# Patient Record
Sex: Male | Born: 1955 | Race: White | Hispanic: No | Marital: Single
Health system: Southern US, Community
[De-identification: ages and names within clinical notes are randomized; demographics above are authoritative.]

## PROBLEM LIST (undated history)

## (undated) DIAGNOSIS — G35 Multiple sclerosis: Secondary | ICD-10-CM

## (undated) DIAGNOSIS — R569 Unspecified convulsions: Secondary | ICD-10-CM

## (undated) DIAGNOSIS — H539 Unspecified visual disturbance: Secondary | ICD-10-CM

## (undated) HISTORY — DX: Multiple sclerosis: G35

## (undated) HISTORY — DX: Unspecified convulsions: R56.9

## (undated) HISTORY — DX: Unspecified visual disturbance: H53.9

---

## 1998-02-24 ENCOUNTER — Emergency Department (HOSPITAL_COMMUNITY): Admission: EM | Admit: 1998-02-24 | Discharge: 1998-02-24 | Payer: Self-pay | Admitting: Emergency Medicine

## 1998-02-25 ENCOUNTER — Encounter: Payer: Self-pay | Admitting: Emergency Medicine

## 1999-04-07 ENCOUNTER — Encounter: Admission: RE | Admit: 1999-04-07 | Discharge: 1999-04-07 | Payer: Self-pay | Admitting: Family Medicine

## 2001-04-06 ENCOUNTER — Emergency Department (HOSPITAL_COMMUNITY): Admission: EM | Admit: 2001-04-06 | Discharge: 2001-04-06 | Payer: Self-pay | Admitting: *Deleted

## 2003-10-10 ENCOUNTER — Emergency Department (HOSPITAL_COMMUNITY): Admission: EM | Admit: 2003-10-10 | Discharge: 2003-10-11 | Payer: Self-pay | Admitting: Emergency Medicine

## 2010-11-28 ENCOUNTER — Telehealth: Payer: Self-pay | Admitting: Internal Medicine

## 2010-11-29 NOTE — Telephone Encounter (Signed)
Error/njr °

## 2013-11-27 ENCOUNTER — Ambulatory Visit (INDEPENDENT_AMBULATORY_CARE_PROVIDER_SITE_OTHER): Payer: BC Managed Care – PPO | Admitting: Family Medicine

## 2013-11-27 VITALS — BP 116/74 | HR 86 | Temp 98.4°F | Resp 18

## 2013-11-27 DIAGNOSIS — Z720 Tobacco use: Secondary | ICD-10-CM

## 2013-11-27 DIAGNOSIS — J069 Acute upper respiratory infection, unspecified: Secondary | ICD-10-CM

## 2013-11-27 DIAGNOSIS — G35 Multiple sclerosis: Secondary | ICD-10-CM

## 2013-11-27 MED ORDER — DOXYCYCLINE HYCLATE 100 MG PO CAPS: 100.0000 mg | ORAL_CAPSULE | Freq: Two times a day (BID) | ORAL | Status: DC

## 2013-11-27 NOTE — Progress Notes (Signed)
   Subjective:    Patient ID: Derek Sims, male    DOB: [birth year], 57 y.o.   MRN: [id number]  HPI Patient presents today with 4 day history of dry cough and "lung pain" with coughing. No sputum production. Smokes 1 ppd. He has taking OTC Robitussin with good relief of cough. He slept well last night. He has never ben diagnosed with COPD/asthma. Patient has tried to quit smoking a couple of times in the past. Not currently considering quitting even though his mother keeps after him.   Patient with history of MS. Takes betaseron injections every other day. Is wheelchair bound. Is able to transfer and perform ADLs. He drives. He sees Dr. Renne Crigler in [place].His disease has been stable for many years. He was diagnosed about 30 years ago. 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   Past Medical History  Diagnosis Date  . MS (multiple sclerosis)    History reviewed. No pertinent past surgical history. History reviewed. No pertinent family history. History  Substance Use Topics  . Smoking status: Current Every Day Smoker -- 1.00 packs/day    Types: Cigarettes  . Smokeless tobacco: Not on file  . Alcohol Use: Not on file    Review of Systems No fever/chills, no wheezing, no SOB, some sore throat, congestion and ear pain.     Objective:   Physical Exam  Vitals reviewed. Constitutional: He is oriented to person, place, and time. He appears well-developed and well-nourished.  HENT:  Head: Normocephalic and atraumatic.  Right Ear: Tympanic membrane, external ear and ear canal normal.  Left Ear: Tympanic membrane, external ear and ear canal normal.  Nose: Mucosal edema present. Right sinus exhibits no maxillary sinus tenderness and no frontal sinus tenderness. Left sinus exhibits no maxillary sinus tenderness and no frontal sinus tenderness.  Mouth/Throat: Uvula is midline and oropharynx is clear and moist.  Eyes: Conjunctivae are normal.  Neck: Normal range of motion. Neck supple.    Cardiovascular: Normal rate, regular rhythm and normal heart sounds.   Pulmonary/Chest: Effort normal. No respiratory distress. Wheezes: few faint upper, anterior expiratory wheezes after coughing. He has no rales. He exhibits no tenderness.  Musculoskeletal:  Patient able to transfer from wheelchair, able to stand briefly.    Neurological: He is alert and oriented to person, place, and time.  Skin: Skin is warm and dry.  Psychiatric: He has a normal mood and affect. His behavior is normal. Judgment and thought content normal.      Assessment & Plan:  Discussed with Dr. Patsy Lager 1. Acute upper respiratory infection -Provided written and verbal information regarding diagnosis and treatment. - doxycycline (VIBRAMYCIN) 100 MG capsule; Take 1 capsule (100 mg total) by mouth 2 (two) times daily.  Dispense: 14 capsule; Refill: 0 - continue OTC cough syrup -push fluids -Mucinex q12 hours PRN -Follow up if no improvement in 3-4 days, sooner if worsening symptoms/fever/chills/SOB  2. Tobacco abuse -Encouraged smoking cessation  3. Multiple sclerosis -Continued follow up as scheduled.    Emi Belfast, FNP-BC  [facility], [facility]  11/27/2013 2:35 PM

## 2013-11-27 NOTE — Patient Instructions (Signed)
Plain Mucinex  Acute Bronchitis Bronchitis is inflammation of the airways that extend from the windpipe into the lungs (bronchi). The inflammation often causes mucus to develop. This leads to a cough, which is the most common symptom of bronchitis.  In acute bronchitis, the condition usually develops suddenly and goes away over time, usually in a couple weeks. Smoking, allergies, and asthma can make bronchitis worse. Repeated episodes of bronchitis may cause further lung problems.  CAUSES Acute bronchitis is most often caused by the same virus that causes a cold. The virus can spread from person to person (contagious) through coughing, sneezing, and touching contaminated objects. SIGNS AND SYMPTOMS   Cough.   Fever.   Coughing up mucus.   Body aches.   Chest congestion.   Chills.   Shortness of breath.   Sore throat.  DIAGNOSIS  Acute bronchitis is usually diagnosed through a physical exam. Your health care provider will also ask you questions about your medical history. Tests, such as chest X-rays, are sometimes done to rule out other conditions.  TREATMENT  Acute bronchitis usually goes away in a couple weeks. Oftentimes, no medical treatment is necessary. Medicines are sometimes given for relief of fever or cough. Antibiotic medicines are usually not needed but may be prescribed in certain situations. In some cases, an inhaler may be recommended to help reduce shortness of breath and control the cough. A cool mist vaporizer may also be used to help thin bronchial secretions and make it easier to clear the chest.  HOME CARE INSTRUCTIONS  Get plenty of rest.   Drink enough fluids to keep your urine clear or pale yellow (unless you have a medical condition that requires fluid restriction). Increasing fluids may help thin your respiratory secretions (sputum) and reduce chest congestion, and it will prevent dehydration.   Take medicines only as directed by your health care  provider.  If you were prescribed an antibiotic medicine, finish it all even if you start to feel better.  Avoid smoking and secondhand smoke. Exposure to cigarette smoke or irritating chemicals will make bronchitis worse. If you are a smoker, consider using nicotine gum or skin patches to help control withdrawal symptoms. Quitting smoking will help your lungs heal faster.   Reduce the chances of another bout of acute bronchitis by washing your hands frequently, avoiding people with cold symptoms, and trying not to touch your hands to your mouth, nose, or eyes.   Keep all follow-up visits as directed by your health care provider.  SEEK MEDICAL CARE IF: Your symptoms do not improve after 1 week of treatment.  SEEK IMMEDIATE MEDICAL CARE IF:  You develop an increased fever or chills.   You have chest pain.   You have severe shortness of breath.  You have bloody sputum.   You develop dehydration.  You faint or repeatedly feel like you are going to pass out.  You develop repeated vomiting.  You develop a severe headache. MAKE SURE YOU:   Understand these instructions.  Will watch your condition.  Will get help right away if you are not doing well or get worse. Document Released: 03/08/2004 Document Revised: 06/15/2013 Document Reviewed: 07/22/2012 [facility] Patient Information 2015 [place], [place]. This information is not intended to replace advice given to you by your health care provider. Make sure you discuss any questions you have with your health care provider.

## 2014-04-04 ENCOUNTER — Emergency Department (HOSPITAL_COMMUNITY): Payer: Medicare Other

## 2014-04-04 ENCOUNTER — Emergency Department (HOSPITAL_COMMUNITY)
Admission: EM | Admit: 2014-04-04 | Discharge: 2014-04-04 | Disposition: A | Discharge status: Home / Self Care | Payer: Medicare Other | Attending: Emergency Medicine | Admitting: Emergency Medicine

## 2014-04-04 DIAGNOSIS — G35 Multiple sclerosis: Secondary | ICD-10-CM | POA: Insufficient documentation

## 2014-04-04 DIAGNOSIS — R05 Cough: Secondary | ICD-10-CM | POA: Diagnosis not present

## 2014-04-04 DIAGNOSIS — Z79899 Other long term (current) drug therapy: Secondary | ICD-10-CM | POA: Insufficient documentation

## 2014-04-04 DIAGNOSIS — N3000 Acute cystitis without hematuria: Secondary | ICD-10-CM | POA: Insufficient documentation

## 2014-04-04 DIAGNOSIS — J42 Unspecified chronic bronchitis: Secondary | ICD-10-CM | POA: Diagnosis not present

## 2014-04-04 DIAGNOSIS — Z792 Long term (current) use of antibiotics: Secondary | ICD-10-CM | POA: Diagnosis not present

## 2014-04-04 DIAGNOSIS — R030 Elevated blood-pressure reading, without diagnosis of hypertension: Secondary | ICD-10-CM | POA: Diagnosis not present

## 2014-04-04 DIAGNOSIS — R55 Syncope and collapse: Secondary | ICD-10-CM | POA: Diagnosis not present

## 2014-04-04 DIAGNOSIS — J341 Cyst and mucocele of nose and nasal sinus: Secondary | ICD-10-CM | POA: Diagnosis not present

## 2014-04-04 DIAGNOSIS — J0140 Acute pansinusitis, unspecified: Secondary | ICD-10-CM | POA: Diagnosis not present

## 2014-04-04 DIAGNOSIS — R410 Disorientation, unspecified: Secondary | ICD-10-CM

## 2014-04-04 DIAGNOSIS — M79662 Pain in left lower leg: Secondary | ICD-10-CM | POA: Diagnosis present

## 2014-04-04 DIAGNOSIS — G4751 Confusional arousals: Secondary | ICD-10-CM | POA: Insufficient documentation

## 2014-04-04 DIAGNOSIS — Z720 Tobacco use: Secondary | ICD-10-CM | POA: Insufficient documentation

## 2014-04-04 DIAGNOSIS — F17200 Nicotine dependence, unspecified, uncomplicated: Secondary | ICD-10-CM | POA: Diagnosis not present

## 2014-04-04 DIAGNOSIS — R109 Unspecified abdominal pain: Secondary | ICD-10-CM | POA: Diagnosis not present

## 2014-04-04 LAB — CBC WITH DIFFERENTIAL/PLATELET
BASOS PCT: 0 % (ref 0–1)
Basophils Absolute: 0 10*3/uL (ref 0.0–0.1)
EOS PCT: 1 % (ref 0–5)
Eosinophils Absolute: 0.1 10*3/uL (ref 0.0–0.7)
HCT: 44.2 % (ref 39.0–52.0)
Hemoglobin: 14.8 g/dL (ref 13.0–17.0)
Lymphocytes Relative: 5 % — ABNORMAL LOW (ref 12–46)
Lymphs Abs: 0.5 10*3/uL — ABNORMAL LOW (ref 0.7–4.0)
MCH: 29 pg (ref 26.0–34.0)
MCHC: 33.5 g/dL (ref 30.0–36.0)
MCV: 86.5 fL (ref 78.0–100.0)
MONOS PCT: 6 % (ref 3–12)
Monocytes Absolute: 0.6 10*3/uL (ref 0.1–1.0)
NEUTROS ABS: 9.4 10*3/uL — AB (ref 1.7–7.7)
Neutrophils Relative %: 88 % — ABNORMAL HIGH (ref 43–77)
Platelets: 178 10*3/uL (ref 150–400)
RBC: 5.11 MIL/uL (ref 4.22–5.81)
RDW: 12.7 % (ref 11.5–15.5)
WBC: 10.6 10*3/uL — ABNORMAL HIGH (ref 4.0–10.5)

## 2014-04-04 LAB — URINE MICROSCOPIC-ADD ON

## 2014-04-04 LAB — URINALYSIS, ROUTINE W REFLEX MICROSCOPIC
Bilirubin Urine: NEGATIVE
Glucose, UA: NEGATIVE mg/dL
KETONES UR: NEGATIVE mg/dL
NITRITE: POSITIVE — AB
Protein, ur: NEGATIVE mg/dL
SPECIFIC GRAVITY, URINE: 1.016 (ref 1.005–1.030)
Urobilinogen, UA: 0.2 mg/dL (ref 0.0–1.0)
pH: 7.5 (ref 5.0–8.0)

## 2014-04-04 LAB — RAPID URINE DRUG SCREEN, HOSP PERFORMED
AMPHETAMINES: NOT DETECTED
Barbiturates: NOT DETECTED
Benzodiazepines: NOT DETECTED
Cocaine: NOT DETECTED
Opiates: NOT DETECTED
TETRAHYDROCANNABINOL: NOT DETECTED

## 2014-04-04 LAB — COMPREHENSIVE METABOLIC PANEL
ALBUMIN: 3.9 g/dL (ref 3.5–5.2)
ALT: 49 U/L (ref 0–53)
AST: 34 U/L (ref 0–37)
Alkaline Phosphatase: 72 U/L (ref 39–117)
Anion gap: 6 (ref 5–15)
BILIRUBIN TOTAL: 0.6 mg/dL (ref 0.3–1.2)
BUN: 11 mg/dL (ref 6–23)
CO2: 27 mmol/L (ref 19–32)
CREATININE: 1.18 mg/dL (ref 0.50–1.35)
Calcium: 9.8 mg/dL (ref 8.4–10.5)
Chloride: 105 mmol/L (ref 96–112)
GFR calc Af Amer: 77 mL/min — ABNORMAL LOW (ref >90)
GFR, EST NON AFRICAN AMERICAN: 67 mL/min — AB (ref >90)
GLUCOSE: 126 mg/dL — AB (ref 70–99)
POTASSIUM: 4 mmol/L (ref 3.5–5.1)
SODIUM: 138 mmol/L (ref 135–145)
Total Protein: 6.9 g/dL (ref 6.0–8.3)

## 2014-04-04 LAB — CK: Total CK: 86 U/L (ref 7–232)

## 2014-04-04 LAB — ETHANOL

## 2014-04-04 MED ORDER — SODIUM CHLORIDE 0.9 % IV BOLUS (SEPSIS)
1000.0000 mL | Freq: Once | INTRAVENOUS | Status: AC
  Administered 2014-04-04: 1000 mL via INTRAVENOUS

## 2014-04-04 MED ORDER — NITROFURANTOIN MONOHYD MACRO 100 MG PO CAPS: 100.0000 mg | ORAL_CAPSULE | Freq: Two times a day (BID) | ORAL | Status: DC

## 2014-04-04 MED ORDER — CEPHALEXIN 500 MG PO CAPS: 500.0000 mg | ORAL_CAPSULE | Freq: Four times a day (QID) | ORAL | Status: DC

## 2014-04-04 NOTE — ED Provider Notes (Signed)
CSN: [id number]     Arrival date & time 04/04/14  1557 History   First MD Initiated Contact with Patient 04/04/14 1557     Chief Complaint  Patient presents with  . Extremity Pain     (Consider location/radiation/quality/duration/timing/severity/associated sxs/prior Treatment) Patient is a 59 y.o. male presenting with general illness.  Illness Location:  "everywhere" LE>UE Quality:  Pain Severity:  Moderate Onset quality:  Gradual Duration:  1 day Timing:  Constant Progression:  Resolved Chronicity:  Recurrent Context:  HO MS, multiple flares, ? seizure this morning which pt has full awareness of Relieved by:  Fentanyl given prior to arrival Worsened by:  Nothing Associated symptoms: abdominal pain, cough, loss of consciousness and nausea   Associated symptoms: no diarrhea and no vomiting     Past Medical History  Diagnosis Date  . MS (multiple sclerosis)    No past surgical history on file. No family history on file. History  Substance Use Topics  . Smoking status: Current Every Day Smoker -- 1.00 packs/day    Types: Cigarettes  . Smokeless tobacco: Not on file  . Alcohol Use: Not on file    Review of Systems  Respiratory: Positive for cough.   Gastrointestinal: Positive for nausea and abdominal pain. Negative for vomiting and diarrhea.  Neurological: Positive for loss of consciousness.  All other systems reviewed and are negative.     Allergies  Review of patient's allergies indicates no known allergies.  Home Medications   Prior to Admission medications   Medication Sig Start Date End Date Taking? Authorizing Provider  Interferon Beta-1b (BETASERON [place]) Inject into the skin every other day.   Yes Historical Provider, MD  cephALEXin (KEFLEX) 500 MG capsule Take 1 capsule (500 mg total) by mouth 4 (four) times daily. 04/04/14   Mirian Mo, MD  doxycycline (VIBRAMYCIN) 100 MG capsule Take 1 capsule (100 mg total) by mouth 2 (two) times daily. 11/27/13    Emi Belfast, FNP   BP 115/74 mmHg  Pulse 106  Temp(Src) 98.2 F (36.8 C) (Oral)  Resp 16  SpO2 96% Physical Exam  Constitutional: He is oriented to person, place, and time. He appears well-developed and well-nourished.  HENT:  Head: Normocephalic and atraumatic.  Eyes: Conjunctivae and EOM are normal.  Neck: Normal range of motion. Neck supple.  Cardiovascular: Normal rate, regular rhythm and normal heart sounds.   Pulmonary/Chest: Effort normal and breath sounds normal. No respiratory distress.  Abdominal: He exhibits no distension. There is no tenderness. There is no rebound and no guarding.  Musculoskeletal: Normal range of motion.  Neurological: He is alert and oriented to person, place, and time. No cranial nerve deficit. GCS eye subscore is 4. GCS verbal subscore is 5. GCS motor subscore is 6.  Sensation diminished bil LE, strength 0/5 bil le, strength 5/5 ue with intact sensation  Skin: Skin is warm and dry.  Vitals reviewed.   ED Course  Procedures (including critical care time) Labs Review Labs Reviewed  CBC WITH DIFFERENTIAL/PLATELET - Abnormal; Notable for the following:    WBC 10.6 (*)    Neutrophils Relative % 88 (*)    Neutro Abs 9.4 (*)    Lymphocytes Relative 5 (*)    Lymphs Abs 0.5 (*)    All other components within normal limits  COMPREHENSIVE METABOLIC PANEL - Abnormal; Notable for the following:    Glucose, Bld 126 (*)    GFR calc non Af Amer 67 (*)    GFR calc Af  Amer 40 (*)    All other components within normal limits  URINALYSIS, ROUTINE W REFLEX MICROSCOPIC - Abnormal; Notable for the following:    APPearance CLOUDY (*)    Hgb urine dipstick TRACE (*)    Nitrite POSITIVE (*)    Leukocytes, UA LARGE (*)    All other components within normal limits  URINE MICROSCOPIC-ADD ON - Abnormal; Notable for the following:    Bacteria, UA MANY (*)    All other components within normal limits  URINE CULTURE  CK  ETHANOL  URINE RAPID DRUG SCREEN  (HOSP PERFORMED)    Imaging Review Dg Chest 2 View  04/04/2014   CLINICAL DATA:  Bilateral lower extremity, hand and neck pain. History of similar episodes previously. Multiple sclerosis. Smoker.  EXAM: CHEST  2 VIEW  COMPARISON:  None.  FINDINGS: Normal sized heart. Clear lungs. Minimal diffuse peribronchial thickening and accentuation of the interstitial markings. Minimal scoliosis.  IMPRESSION: Minimal chronic bronchitic changes.  No acute abnormality.   Electronically Signed   By: Beckie Salts M.D.   On: 04/04/2014 17:43   Ct Head Wo Contrast  04/04/2014   CLINICAL DATA:  Bilateral lower extremity, hand, and neck pain. History of MS. History of prior similar episodes.  EXAM: CT HEAD WITHOUT CONTRAST  TECHNIQUE: Contiguous axial images were obtained from the base of the skull through the vertex without intravenous contrast.  COMPARISON:  None.  FINDINGS: Mild diffuse cerebral atrophy. Ventricular dilatation consistent with central atrophy. Low-attenuation changes in the deep white matter consistent with nonspecific white matter disease. Is could be related to patient's history of MS. No mass effect or midline shift. No abnormal extra-axial fluid collections. Gray-white matter junctions are distinct. Basal cisterns are not effaced. No evidence of acute intracranial hemorrhage. No depressed skull fractures. Mild mucosal thickening in the paranasal sinuses with small retention cyst in the left maxillary antrum. Mastoid air cells are not opacified.  IMPRESSION: No acute intracranial abnormalities. Mild chronic atrophy and white matter disease.   Electronically Signed   By: Burman Nieves M.D.   On: 04/04/2014 20:57     EKG Interpretation   Date/Time:  Sunday April 04 2014 16:08:05 EST Ventricular Rate:  111 PR Interval:  147 QRS Duration: 93 QT Interval:  312 QTC Calculation: 424 R Axis:   88 Text Interpretation:  Sinus tachycardia No significant change since last  tracing Confirmed by  Mirian Mo [phone]) on 04/04/2014 4:18:56 PM      MDM   Final diagnoses:  Confusion  Multiple sclerosis  Acute cystitis without hematuria    60 y.o. male with pertinent PMH of MS presents with confusion, generalized pain, and perseveration on thirst.  He has a ho chronic pain episodes, in that aspect current episode was similar.  Apparently, the patient was driving in the car with his mother, began complaining of pain all over. They called EMS and pulled over. On EMS arrival the patient was given 50 mg of fentanyl with total relief of pain. 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 This is unchanged from baseline. Approximately an hour and a half into the patient's care had resolution of his perseveration and per family return to baseline. He has never done this in the past.   Workup as above remarkable only for UTI in pt with indwelling foley.  I discussed the patient with neurology who  did not recommend acute admission, felt patient appropriate for outpatient EEG by his neurologist. We discussed use of antiepileptics, agreed that this was not indicated given the very atypical nature if this was a seizure. Patient discharged home with Keflex. Standard return precautions given.  I have reviewed all laboratory and imaging studies if ordered as above  1. Confusion   2. Multiple sclerosis   3. Acute cystitis without hematuria         Mirian Mo, MD 04/04/14 [phone]

## 2014-04-04 NOTE — ED Notes (Signed)
Dr. Gentry at bedside. 

## 2014-04-04 NOTE — ED Notes (Signed)
Pt via [facility] with c/o bilateral lower extremity, hand, and neck pain s/p MS.  Hx of similar episodes.  Given 50 mg fentanyl with 0/10 pain.  Pt in NAD, A&O.

## 2014-04-04 NOTE — Discharge Instructions (Signed)
Altered Mental Status Altered mental status most often refers to an abnormal change in your responsiveness and awareness. It can affect your speech, thought, mobility, memory, attention span, or alertness. It can range from slight confusion to complete unresponsiveness (coma). Altered mental status can be a sign of a serious underlying medical condition. Rapid evaluation and medical treatment is necessary for patients having an altered mental status. CAUSES   Low blood sugar (hypoglycemia) or diabetes.  Severe loss of body fluids (dehydration) or a body salt (electrolyte) imbalance.  A stroke or other neurologic problem, such as dementia or delirium.  A head injury or tumor.  A drug or alcohol overdose.  Exposure to toxins or poisons.  Depression, anxiety, and stress.  A low oxygen level (hypoxia).  An infection.  Blood loss.  Twitching or shaking (seizure).  Heart problems, such as heart attack or heart rhythm problems (arrhythmias).  A body temperature that is too low or too high (hypothermia or hyperthermia). DIAGNOSIS  A diagnosis is based on your history, symptoms, physical and neurologic examinations, and diagnostic tests. Diagnostic tests may include:  Measurement of your blood pressure, pulse, breathing, and oxygen levels (vital signs).  Blood tests.  Urine tests.  X-ray exams.  A computerized magnetic scan (magnetic resonance imaging, MRI).  A computerized X-ray scan (computed tomography, CT scan). TREATMENT  Treatment will depend on the cause. Treatment may include:  Management of an underlying medical or mental health condition.  Critical care or support in the hospital. HOME CARE INSTRUCTIONS   Only take over-the-counter or prescription medicines for pain, discomfort, or fever as directed by your caregiver.  Manage underlying conditions as directed by your caregiver.  Eat a healthy, well-balanced diet to maintain strength.  Join a support group or  prevention program to cope with the condition or trauma that caused the altered mental status. Ask your caregiver to help choose a program that works for you.  Follow up with your caregiver for further examination, therapy, or testing as directed. SEEK MEDICAL CARE IF:   You feel unwell or have chills.  You or your family notice a change in your behavior or your alertness.  You have trouble following your caregiver's treatment plan.  You have questions or concerns. SEEK IMMEDIATE MEDICAL CARE IF:   You have a rapid heartbeat or have chest pain.  You have difficulty breathing.  You have a fever.  You have a headache with a stiff neck.  You cough up blood.  You have blood in your urine or stool.  You have severe agitation or confusion. MAKE SURE YOU:   Understand these instructions.  Will watch your condition.  Will get help right away if you are not doing well or get worse. Document Released: 07/19/2009 Document Revised: 04/23/2011 Document Reviewed: 07/19/2009 [facility] Patient Information 2015 [place], [place]. This information is not intended to replace advice given to you by your health care provider. Make sure you discuss any questions you have with your health care provider. Multiple Sclerosis Multiple sclerosis (MS) is a disease of the central nervous system. It leads to the loss of the insulating covering of the nerves (myelin sheath) of your brain. When this happens, brain signals do not get sent properly or may not get sent at all. The age of onset of MS varies.  CAUSES The cause of MS is unknown. However, it is more common in the [place] than in the [place]. RISK FACTORS There is a higher number of women  with MS than men. MS is not an illness that is passed down to you from your family members (inherited). However, your risk of MS is higher if you have a relative with MS. SIGNS AND SYMPTOMS  The symptoms of MS occur in episodes or  attacks. These attacks may last weeks to months. There may be long periods of almost no symptoms between attacks. The symptoms of MS vary. This is because of the many different ways it affects the central nervous system. The main symptoms of MS include:  Vision problems and eye pain.  Numbness.  Weakness.  Inability to move your arms, hands, feet, or legs (paralysis).  Balance problems.  Tremors. DIAGNOSIS  Your health care provider can diagnose MS with the help of imaging exams and lab tests. These may include specialized X-ray exams and spinal fluid tests. The best imaging exam to confirm a diagnosis of MS is an MRI. TREATMENT  There is no known cure for MS, but there are medicines that can decrease the number and frequency of attacks. Steroids are often used for short-term relief. Physical and occupational therapy may also help. There are also many new alternative or complementary treatments available to help control the symptoms of MS. Ask your health care provider if any of these other options are right for you. HOME CARE INSTRUCTIONS   Take medicines as directed by your health care provider.  Exercise as directed by your health care provider. SEEK MEDICAL CARE IF: You begin to feel depressed. SEEK IMMEDIATE MEDICAL CARE IF:  You develop paralysis.  You have problems with bladder, bowel, or sexual function.  You develop mental changes, such as forgetfulness or mood swings.  You have a period of uncontrolled movements (seizure). Document Released: 01/27/2000 Document Revised: 02/03/2013 Document Reviewed: 10/06/2012 [facility] Patient Information 2015 [place], [place]. This information is not intended to replace advice given to you by your health care provider. Make sure you discuss any questions you have with your health care provider.

## 2014-04-06 LAB — URINE CULTURE: Colony Count: >=100000

## 2015-07-21 ENCOUNTER — Encounter: Payer: Self-pay | Admitting: Neurology

## 2015-07-21 ENCOUNTER — Ambulatory Visit (INDEPENDENT_AMBULATORY_CARE_PROVIDER_SITE_OTHER): Payer: Medicare Other | Admitting: Neurology

## 2015-07-21 DIAGNOSIS — G35 Multiple sclerosis: Secondary | ICD-10-CM | POA: Insufficient documentation

## 2015-07-21 DIAGNOSIS — Z993 Dependence on wheelchair: Secondary | ICD-10-CM | POA: Insufficient documentation

## 2015-07-21 DIAGNOSIS — R339 Retention of urine, unspecified: Secondary | ICD-10-CM | POA: Insufficient documentation

## 2015-07-21 DIAGNOSIS — H539 Unspecified visual disturbance: Secondary | ICD-10-CM | POA: Insufficient documentation

## 2015-07-21 DIAGNOSIS — R269 Unspecified abnormalities of gait and mobility: Secondary | ICD-10-CM | POA: Insufficient documentation

## 2015-07-21 NOTE — Progress Notes (Signed)
[facility]  PATIENT: Derek Sims DOB: 03/20/1955  REFERRING DOCTOR OR PCP:  He was seen Harlen Labs at [facility] SOURCE: Patient, records from [place]  _________________________________   HISTORICAL  CHIEF COMPLAINT:  Chief Complaint  Patient presents with  . Multiple Sclerosis    Derek Sims is here with his mother Britta Mccreedy for eval of MS.  Sts. he was dx. in his early 20's.  Presenitng sx. was double vision. He has been followed by multiple neuroligists, most recently Dr. Renne Crigler.  He has been lost to follow up for the last 3 years.  He and his mother are both poor historians.  Sts. he believes he has been on Betaseron for the last 20 yrs. or so.  Is not clear if he has ever been on any other MS med.  He has been in a w/c for at least 15 yrs.  Sts. he doesn't walk at all but can stand  . Gait Disturbance    to transfer by himself.  He is here today for a w/c eval.  He would like to get another manual w/c; is opposed to a power w/c./fim    HISTORY OF PRESENT ILLNESS:  Derek Sims was seen at [facility] for a neurologic consultation regarding his multiple sclerosis and his mobility issues.   He is a 60 year old man who was diagnosed with MS in 1988 and has been wheelchair bound since 2001.   MOBILITY ISSUES: He is using a Breezy 510 manual wheelchair and gets around very well with it. Arm strength is good. This type of wheelchair has enabled him to do his activities of daily living.  He is able to move from room to room without difficulty with this type of wheelchair. He is able to transfer in and out of the wheelchair independently.    This wheelchair has allowed him to do personal grooming, meal preparation and other activities of daily living.  He cannot use a walker or cane due to severe leg weakness. He cannot stand unless he uses very strong bilateral support . Unable to use a walker.   Arm strength is good and he is able  to self propel a manual wheelchair without difficulty. He spends the vast majority of the day in his wheelchair.      MS HISTORY MS:   He was diagnosed with multiple sclerosis in 1988 after presenting with difficulties with walking. Over the next 10 years, he had multiple exacerbations. Somewhat involved walking, otherwise would involve coordination or numbness or diplopia. He would usually improve after the exacerbations but disability started to accumulate during the 1990s and he started to use a cane. He began to use a wheelchair in 2001.  He has been on Betaseron since 1992 or 1993. He saw Dr. Leotis Shames at [facility] until he left about 4 years ago and then transferred his care to Dr. Renne Crigler  Gait/strength/sensation: Currently, he is unable to walk but gets around well in a manual wheelchair. He is able to transfer from the wheelchair to a toilet, bed or couch. Notes weakness in both legs. He denies any significant numbness. There is some clumsiness. His arms are strong  Bladder/bowel:  He has needed to use a catheter for the past 8-10 years. He pertinent a Foley catheter every night and uses it for the whole day and then changes and again the next day. He denies any recent urinary tract infection. He denies any bowel issues.  Vision: He  reports decreased vision in both eyes, worse on the left. Colors are desaturated on the left compared to the right. Appropriate in the past but does not have an issue with double vision now.  Fatigue/sleep: He does not report much fatigue when indoors. However, if he gets hot he will feel much more drained. He generally sleeps well at night.  Mood/Cognition: He notes mild depression but not bad enough to go on a medication. He denies anxiety. He has not noted any significant issues with cognitive function. Specifically, short-term memory and verbal fluency and executive function are doing well.    REVIEW OF SYSTEMS: Constitutional: No fevers, chills,  sweats, or change in appetite.  He notes some fatigue Eyes: No visual changes, double vision, eye pain Ear, nose and throat: No hearing loss, ear pain, nasal congestion, sore throat Cardiovascular: No chest pain, palpitations Respiratory: No shortness of breath at rest or with exertion.   No wheezes GastrointestinaI: No nausea, vomiting, diarrhea, abdominal pain, fecal incontinence Genitourinary: as above Musculoskeletal: No neck pain, back pain Integumentary: No rash, pruritus, skin lesions Neurological: as above Psychiatric: No depression at this time.  No anxiety Endocrine: No palpitations, diaphoresis, change in appetite, change in weigh or increased thirst Hematologic/Lymphatic: No anemia, purpura, petechiae. Allergic/Immunologic: No itchy/runny eyes, nasal congestion, recent allergic reactions, rashes  ALLERGIES: No Known Allergies  HOME MEDICATIONS:  Current outpatient prescriptions:  .  acetaminophen (TYLENOL) 500 MG tablet, Take 500 mg by mouth., Disp: , Rfl:  .  Incontinence Supplies (URINARY LEG BAG) MISC, , Disp: , Rfl:  .  Interferon Beta-1b (BETASERON [place]), Inject into the skin every other day., Disp: , Rfl:  .  Parenteral Therapy Supplies (CATHETER ADAPTER) MISC, , Disp: , Rfl:   PAST MEDICAL HISTORY: Past Medical History  Diagnosis Date  . MS (multiple sclerosis) (HCC)   . Vision abnormalities   . Seizures (HCC)     PAST SURGICAL HISTORY: History reviewed. No pertinent past surgical history.  FAMILY HISTORY: Family History  Problem Relation Age of Onset  . Healthy Mother   . Dementia Father   . Stroke Brother     SOCIAL HISTORY:  Social History   Social History  . Marital Status: Single    Spouse Name: N/A  . Number of Children: N/A  . Years of Education: N/A   Occupational History  . Not on file.   Social History Main Topics  . Smoking status: Current Every Day Smoker -- 1.00 packs/day    Types: Cigarettes  . Smokeless tobacco: Not on  file  . Alcohol Use: Not on file  . Drug Use: Not on file  . Sexual Activity: Not on file   Other Topics Concern  . Not on file   Social History Narrative     PHYSICAL EXAM  There were no vitals filed for this visit.  There is no height or weight on file to calculate BMI.   General: The patient is well-developed and well-nourished and in no acute distress  Eyes:  Funduscopic exam shows normal optic discs and retinal vessels.  Neck: The neck is supple, no carotid bruits are noted.  The neck is nontender.  Cardiovascular: The heart has a regular rate and rhythm with a normal S1 and S2. There were no murmurs, gallops or rubs. Lungs are clear to auscultation.  Skin: Extremities are without significant edema.  Musculoskeletal:  Back is nontender  Neurologic Exam  Mental status: The patient is alert and oriented x 3 at the  time of the examination. The patient has apparent normal recent and remote memory, with an apparently normal attention span and concentration ability.   Speech is normal.  Cranial nerves: Extraocular movements are full. Pupils are equal, round, and reactive to light and accomodation.  Visual fields are full.  Facial symmetry is present. There is good facial sensation to soft touch bilaterally.Facial strength is normal.  Trapezius and sternocleidomastoid strength is normal. No dysarthria is noted.  The tongue is midline, and the patient has symmetric elevation of the soft palate. No obvious hearing deficits are noted.  Motor:  Muscle bulk is normal.   He has increased tone in both legs, worse on the right. Strength is normal in the arms. In the legs, strength is 2-2+/5 in the left leg and 1/5 in the right leg..   Sensory: He reports decreased temperature and vibration sensation in the right face and arm and more symmetric touch sensation.  Coordination: Cerebellar testing reveals good finger-nose-finger and he can't do heel-to-shin bilaterally.  Gait and  station: He is unable to stand without strong bilateral support. Unable to walk.   Reflexes: Deep tendon reflexes are symmetric and normal in arms,  reflexes are increased in the legs with spread and unsustained clonus, worse on the right.    DIAGNOSTIC DATA (LABS, IMAGING, TESTING) - I reviewed patient records, labs, notes, testing and imaging myself where available.  Lab Results  Component Value Date   WBC 10.6* 04/04/2014   HGB 14.8 04/04/2014   HCT 44.2 04/04/2014   MCV 86.5 04/04/2014   PLT 178 04/04/2014      Component Value Date/Time   NA 138 04/04/2014 1637   K 4.0 04/04/2014 1637   CL 105 04/04/2014 1637   CO2 27 04/04/2014 1637   GLUCOSE 126* 04/04/2014 1637   BUN 11 04/04/2014 1637   CREATININE 1.18 04/04/2014 1637   CALCIUM 9.8 04/04/2014 1637   PROT 6.9 04/04/2014 1637   ALBUMIN 3.9 04/04/2014 1637   AST 34 04/04/2014 1637   ALT 49 04/04/2014 1637   ALKPHOS 72 04/04/2014 1637   BILITOT 0.6 04/04/2014 1637   GFRNONAA 67* 04/04/2014 1637   GFRAA 77* 04/04/2014 1637       ASSESSMENT AND PLAN  Multiple sclerosis (HCC) - Plan: For home use only DME standard manual wheelchair with seat cushion  Gait disturbance - Plan: For home use only DME standard manual wheelchair with seat cushion  Wheelchair bound - Plan: For home use only DME standard manual wheelchair with seat cushion  Urinary retention  Visual disturbance   1.     He will continue Betaseron. We briefly discussed some of the other options but he is comfortable staying on Betaseron as she does not get any side effects and has been stable. 2.     He would like to get his wheelchair from [facility] [phone].   I will fax them a prescription and a copy of this note. 3.     He is advised to quit smoking and we discussed options. He is not prepared at this time to do so. 4.     He will return to see me in 6 months but call sooner if there are new or worsening neurologic symptoms.   Taurus  A. Epimenio Foot, MD, PhD 07/21/2015, 10:26 AM Certified in Neurology, Clinical Neurophysiology, Sleep Medicine, Pain Medicine and Neuroimaging  [facility] [address] [place], [place] [address] [phone]

## 2015-07-25 ENCOUNTER — Telehealth: Payer: Self-pay | Admitting: Neurology

## 2015-07-25 DIAGNOSIS — G35 Multiple sclerosis: Secondary | ICD-10-CM

## 2015-07-25 DIAGNOSIS — Z993 Dependence on wheelchair: Secondary | ICD-10-CM

## 2015-07-25 DIAGNOSIS — R269 Unspecified abnormalities of gait and mobility: Secondary | ICD-10-CM

## 2015-07-25 MED ORDER — INTERFERON BETA-1B 0.3 MG ~~LOC~~ KIT: 0.2500 mg | PACK | SUBCUTANEOUS | Status: DC

## 2015-07-25 NOTE — Telephone Encounter (Signed)
I have spoken with pt. this morning.  I need to know what pharmacy he gets his Betaseron from.  He sts. this info is at home--he is not home right now but will call with name of pharmacy when he returns/fim

## 2015-07-25 NOTE — Telephone Encounter (Addendum)
[name]/[facility] [phone] called regarding fax received on Thursday June 8th for manual wheelchair, states in order for Medicare to cover, will need Rx for Wheelchair evaluation, PT, OT. Fax# [phone].

## 2015-07-25 NOTE — Addendum Note (Signed)
Addended by: Candis Schatz I on: 07/25/2015 12:09 PM   Modules accepted: Orders

## 2015-07-25 NOTE — Telephone Encounter (Signed)
Betaseron rx. escribed to [facility]/fim

## 2015-07-25 NOTE — Telephone Encounter (Signed)
Pharmacy is [facility]  phone: [phone]

## 2015-07-25 NOTE — Telephone Encounter (Signed)
RAS is ooo this week, so order for pt/ot eval for w/c eval signed by Dr. Roda Shutters faxed to [facility] at fax # [phone]/fim

## 2015-07-25 NOTE — Telephone Encounter (Signed)
Patient requesting refill of Interferon Beta-1b (BETASERON [facility]: on scholarship program with Betaseron

## 2015-08-02 ENCOUNTER — Telehealth: Payer: Self-pay | Admitting: Neurology

## 2015-08-02 DIAGNOSIS — Z433 Encounter for attention to colostomy: Secondary | ICD-10-CM

## 2015-08-02 DIAGNOSIS — R32 Unspecified urinary incontinence: Secondary | ICD-10-CM

## 2015-08-02 DIAGNOSIS — G35 Multiple sclerosis: Secondary | ICD-10-CM

## 2015-08-02 DIAGNOSIS — Z993 Dependence on wheelchair: Secondary | ICD-10-CM

## 2015-08-02 NOTE — Telephone Encounter (Signed)
Pt's mother called in requesting colostomy bag and supplies be ordered. She said [facility] has all his information. They are requesting a rx for this. Please call [phone], Munir Nikirk ( mother)

## 2015-08-02 NOTE — Telephone Encounter (Signed)
I have spoken with Mrs. Farnell this afternoon and explained that RAS does not manage colostomies--would not even know what supplies to order. Pt. should see his pcp for this.  Mrs. Speagle sts. Dr. Tinnie Gens always ordered pt's colostomy supplies--I have explained that this is not Dr. Bonnita Hollow specialty--have ordered referral to pcp so that pt. may get established with a pcp who can manage these things for him./fim

## 2015-08-03 NOTE — Telephone Encounter (Signed)
Mother called back to advise, what patient actually needs are urinary bags and the supplies that go along with that sent to [facility] not colostomy bag and supplies.

## 2015-08-03 NOTE — Telephone Encounter (Signed)
LMTC.  Dr. Epimenio Foot is not able to manage pt's colostomy/supplies.  This is not is specialty, and he has no knowledge of supplies that are needed, would not be able to manage any problems, such as infection, that arise with colostomy.  Pt. should see pcp or gastroenterologist.  I put in a referral for a pcp for pt. yesterday, as, at the very least, he should have a pcp who can help manage pt's care, yearly physicals, etc. We can also order a referral to gi if he prefers./fim

## 2015-08-04 NOTE — Telephone Encounter (Signed)
I have spoken with Derek Sims, yesterday and again this morning.  He sts. his mother requested colostomy supplies, but what he actually needs is urinary catheters.  I have spoken with the pharmacist at [facility] and requested paperwork for catheters be sent to my attn. at fax # [phone].  Derek Sims is not clear on exactly what time/size caths he uses, so we need this info from the pharmacy.  RAS is agreeable to signing ongoing order for this/fim

## 2015-08-04 NOTE — Telephone Encounter (Signed)
Request for catheters received, awaiting RAS sig/fim

## 2015-08-04 NOTE — Addendum Note (Signed)
Addended by: Candis Schatz I on: 08/04/2015 12:19 PM   Modules accepted: Orders

## 2015-08-08 DIAGNOSIS — R269 Unspecified abnormalities of gait and mobility: Secondary | ICD-10-CM | POA: Diagnosis not present

## 2015-08-08 DIAGNOSIS — Z993 Dependence on wheelchair: Secondary | ICD-10-CM | POA: Diagnosis not present

## 2015-08-08 DIAGNOSIS — R29898 Other symptoms and signs involving the musculoskeletal system: Secondary | ICD-10-CM | POA: Diagnosis not present

## 2015-08-08 DIAGNOSIS — G709 Myoneural disorder, unspecified: Secondary | ICD-10-CM | POA: Diagnosis not present

## 2015-08-08 DIAGNOSIS — G35 Multiple sclerosis: Secondary | ICD-10-CM | POA: Diagnosis not present

## 2015-08-15 ENCOUNTER — Telehealth: Payer: Self-pay | Admitting: Neurology

## 2015-08-15 NOTE — Telephone Encounter (Signed)
Patient is calling and would like to know if Dr. Felecia Shelling wants him to continue Rx Interferon Beta-1 lb. 0.3 mg kit injections.  Please call him. Thanks!

## 2015-08-17 NOTE — Telephone Encounter (Signed)
I have spoken with Derek Sims this morning and per RAS last ov, advised he should continue Betaseron.  He verbalized understanding of same/fim

## 2015-08-22 ENCOUNTER — Telehealth: Payer: Self-pay | Admitting: *Deleted

## 2015-08-22 DIAGNOSIS — R269 Unspecified abnormalities of gait and mobility: Secondary | ICD-10-CM | POA: Diagnosis not present

## 2015-08-22 DIAGNOSIS — R29898 Other symptoms and signs involving the musculoskeletal system: Secondary | ICD-10-CM | POA: Diagnosis not present

## 2015-08-22 DIAGNOSIS — G709 Myoneural disorder, unspecified: Secondary | ICD-10-CM | POA: Diagnosis not present

## 2015-08-22 DIAGNOSIS — G35 Multiple sclerosis: Secondary | ICD-10-CM | POA: Diagnosis not present

## 2015-08-22 MED ORDER — INTERFERON BETA-1B 0.3 MG ~~LOC~~ KIT: 0.2500 mg | PACK | SUBCUTANEOUS | Status: DC

## 2015-08-22 NOTE — Telephone Encounter (Signed)
Betaseron escribed to [facility] per faxed request/fim 

## 2015-09-05 ENCOUNTER — Telehealth: Payer: Self-pay | Admitting: Neurology

## 2015-09-05 NOTE — Telephone Encounter (Signed)
Duplicate/fim 

## 2015-09-05 NOTE — Telephone Encounter (Signed)
[name]/ [facility] call to let our office know she has faxed over forms to be filled out, signed and faxed back due to pt having medicare. Please call to give her status - [phone], Fax: [phone]

## 2015-09-05 NOTE — Telephone Encounter (Signed)
Patient's mother is calling and states her son needs a wheelchair made and has been fitted by [facility] -[phone].  They are saying they need an order for the wheelchair from Dr. Epimenio Foot.  Contact [name].  Please call [facility] and patient's mother. Thanks!

## 2015-09-05 NOTE — Telephone Encounter (Signed)
LMOM for [name] at [facility] that I have not received paperwork yet, but will address it when I do/fim

## 2015-09-06 NOTE — Telephone Encounter (Signed)
Paperwork completed, signed and faxed back - called [name] to inform.

## 2015-09-06 NOTE — Telephone Encounter (Signed)
Derek Sims called and wanted to speak with Derek Sims in regard to her husband's wheelchair.  Call was picked up by Derek Sims.

## 2015-09-06 NOTE — Telephone Encounter (Signed)
[name]/ [facility] called after speaking with the pt's wife this morning. She is upset that the paper work has not been done. [name] did have the correct fax numbers. I gave her my fax number [phone] so I would know if it come in. When/if I receive the paper work I will give to [employer]. [name] expressed understanding. [phone]

## 2015-09-08 NOTE — Telephone Encounter (Signed)
[name] with [facility] is calling regarding a form that was faxed to her and the dates are missing.

## 2015-09-08 NOTE — Telephone Encounter (Signed)
Date added and form faxed back/fim

## 2015-11-03 DIAGNOSIS — Z4689 Encounter for fitting and adjustment of other specified devices: Secondary | ICD-10-CM | POA: Diagnosis not present

## 2015-11-03 DIAGNOSIS — G709 Myoneural disorder, unspecified: Secondary | ICD-10-CM | POA: Diagnosis not present

## 2015-11-03 DIAGNOSIS — G35 Multiple sclerosis: Secondary | ICD-10-CM | POA: Diagnosis not present

## 2015-11-03 DIAGNOSIS — R269 Unspecified abnormalities of gait and mobility: Secondary | ICD-10-CM | POA: Diagnosis not present

## 2016-01-20 ENCOUNTER — Ambulatory Visit: Payer: Medicare Other | Admitting: Neurology

## 2016-01-24 ENCOUNTER — Encounter: Payer: Self-pay | Admitting: Neurology

## 2016-06-22 ENCOUNTER — Telehealth: Payer: Self-pay | Admitting: Neurology

## 2016-06-22 NOTE — Telephone Encounter (Signed)
Left message for Morrie Sheldon to call back/fim

## 2016-06-22 NOTE — Telephone Encounter (Signed)
Caryl Pina from [facility] is wanting to know if a Prior Josem Kaufmann is needed for the Interferon Beta-1b (BETASERON/EXTAVIA) 0.3 MG KIT injection .  Pt told Caryl Pina on 4-12 that he does not take this as he is supposed to and he feels okay with out it.  Caryl Pina would like to know what you want them to do

## 2016-06-22 NOTE — Telephone Encounter (Signed)
Please call Morrie Sheldon at [phone] xt [phone]

## 2016-06-25 ENCOUNTER — Telehealth: Payer: Self-pay | Admitting: Neurology

## 2016-06-25 NOTE — Telephone Encounter (Signed)
[name] from [facility] called for  Incontinence Supplies (URINARY LEG BAG) MISC  Parenteral Therapy Supplies (CATHETER ADAPTER) MISC  She has requested 11 refills (for the year) she is asking if this can be faxed into them at [phone]  their phone# is [phone]

## 2016-06-25 NOTE — Telephone Encounter (Signed)
I have spoken with Derek Sims this afternoon--pt. needs appt.  He has not been seen since 07/21/15, and has no pending appt./fim

## 2016-07-05 ENCOUNTER — Telehealth: Payer: Self-pay | Admitting: *Deleted

## 2016-07-05 NOTE — Telephone Encounter (Signed)
Betaseron PA completed by phone with [facility] phone # [phone].  Pt. has been on Betaseron for 20 yrs./fim

## 2016-07-06 NOTE — Telephone Encounter (Signed)
[facility] [phone] PA approved 07/05/16-07/05/17

## 2016-07-12 NOTE — Telephone Encounter (Signed)
Fax received from [facility] [facility] [place], phone# [phone]. Betaseron PA approved thru 07/05/17.  Member ID# [id number].  PA# [id number]/fim

## 2016-08-14 ENCOUNTER — Other Ambulatory Visit: Payer: Self-pay | Admitting: Neurology

## 2016-08-22 ENCOUNTER — Telehealth: Payer: Self-pay | Admitting: Neurology

## 2016-08-22 NOTE — Telephone Encounter (Signed)
Noted/fim 

## 2016-08-22 NOTE — Telephone Encounter (Signed)
Almyra Free from [facility] called to inform they reached out to Pt re: a refill and was told by pt he has plenty on hand because he does not need this medication Interferon Beta-1b (BETASERON/EXTAVIA) 0.3 MG KIT injection  Pt informed Almyra Free he has not had the medication in about 2 months

## 2016-08-22 NOTE — Telephone Encounter (Signed)
Raynelle Fanning from [facility] stating that they are cancelling the order due to pt no longer taking meds.  She stated if need be she can be reached at [phone]

## 2016-09-15 ENCOUNTER — Other Ambulatory Visit: Payer: Self-pay | Admitting: Neurology

## 2016-09-17 NOTE — Telephone Encounter (Signed)
I called Derek Sims. I explained to him that since he has not been seen in over a year, he will need an appt with Dr. Epimenio Foot for further supplies and medications. Derek Sims is agreeable to an appt on 09/20/16 at 8:30am with Dr. Epimenio Foot. I explained the address and how to get to this office several times with the Derek Sims. Derek Sims verbalized understanding of his appt date and time and how to get here.

## 2016-09-17 NOTE — Telephone Encounter (Signed)
Patient called regarding rx BETASERON. He states that the pharmacy is not filling it and saying that Dr. Marily Memos approve it. He would like to know why this is. Please call and advise.

## 2016-09-20 ENCOUNTER — Ambulatory Visit: Payer: Self-pay | Admitting: Neurology

## 2016-09-21 ENCOUNTER — Encounter: Payer: Self-pay | Admitting: Neurology

## 2017-05-22 DIAGNOSIS — G35 Multiple sclerosis: Secondary | ICD-10-CM | POA: Diagnosis not present

## 2019-09-01 ENCOUNTER — Other Ambulatory Visit: Payer: Self-pay

## 2019-09-02 ENCOUNTER — Ambulatory Visit: Payer: Medicare Other | Admitting: Family Medicine

## 2019-09-17 ENCOUNTER — Ambulatory Visit: Payer: Medicare Other | Admitting: Family Medicine

## 2021-06-02 ENCOUNTER — Encounter (HOSPITAL_COMMUNITY): Payer: Self-pay

## 2021-06-02 ENCOUNTER — Emergency Department (HOSPITAL_COMMUNITY): Payer: Medicare Other

## 2021-06-02 ENCOUNTER — Inpatient Hospital Stay (HOSPITAL_COMMUNITY)
Admission: EM | Admit: 2021-06-02 | Discharge: 2021-06-06 | DRG: 871 | Disposition: A | Discharge status: Home / Self Care | Payer: Medicare Other | Attending: Internal Medicine | Admitting: Internal Medicine

## 2021-06-02 ENCOUNTER — Other Ambulatory Visit: Payer: Self-pay

## 2021-06-02 ENCOUNTER — Inpatient Hospital Stay (HOSPITAL_COMMUNITY): Payer: Medicare Other

## 2021-06-02 DIAGNOSIS — A419 Sepsis, unspecified organism: Secondary | ICD-10-CM | POA: Diagnosis present

## 2021-06-02 DIAGNOSIS — N390 Urinary tract infection, site not specified: Secondary | ICD-10-CM

## 2021-06-02 DIAGNOSIS — J9601 Acute respiratory failure with hypoxia: Secondary | ICD-10-CM | POA: Diagnosis not present

## 2021-06-02 DIAGNOSIS — N179 Acute kidney failure, unspecified: Secondary | ICD-10-CM | POA: Diagnosis present

## 2021-06-02 DIAGNOSIS — L8990 Pressure ulcer of unspecified site, unspecified stage: Secondary | ICD-10-CM | POA: Diagnosis present

## 2021-06-02 DIAGNOSIS — R4182 Altered mental status, unspecified: Secondary | ICD-10-CM | POA: Diagnosis not present

## 2021-06-02 DIAGNOSIS — J9600 Acute respiratory failure, unspecified whether with hypoxia or hypercapnia: Principal | ICD-10-CM

## 2021-06-02 DIAGNOSIS — R400 Somnolence: Secondary | ICD-10-CM | POA: Diagnosis not present

## 2021-06-02 DIAGNOSIS — G35 Multiple sclerosis: Secondary | ICD-10-CM | POA: Diagnosis present

## 2021-06-02 DIAGNOSIS — R32 Unspecified urinary incontinence: Secondary | ICD-10-CM | POA: Diagnosis present

## 2021-06-02 DIAGNOSIS — L89150 Pressure ulcer of sacral region, unstageable: Secondary | ICD-10-CM | POA: Diagnosis present

## 2021-06-02 DIAGNOSIS — Z20822 Contact with and (suspected) exposure to covid-19: Secondary | ICD-10-CM | POA: Diagnosis present

## 2021-06-02 DIAGNOSIS — F17210 Nicotine dependence, cigarettes, uncomplicated: Secondary | ICD-10-CM | POA: Diagnosis present

## 2021-06-02 DIAGNOSIS — A4159 Other Gram-negative sepsis: Secondary | ICD-10-CM | POA: Diagnosis present

## 2021-06-02 DIAGNOSIS — G9341 Metabolic encephalopathy: Secondary | ICD-10-CM | POA: Diagnosis present

## 2021-06-02 DIAGNOSIS — Z993 Dependence on wheelchair: Secondary | ICD-10-CM

## 2021-06-02 DIAGNOSIS — G9340 Encephalopathy, unspecified: Secondary | ICD-10-CM | POA: Diagnosis not present

## 2021-06-02 DIAGNOSIS — R6520 Severe sepsis without septic shock: Secondary | ICD-10-CM | POA: Diagnosis present

## 2021-06-02 HISTORY — DX: Unspecified convulsions: R56.9

## 2021-06-02 HISTORY — DX: Multiple sclerosis: G35

## 2021-06-02 LAB — CBC WITH DIFFERENTIAL/PLATELET
Abs Immature Granulocytes: 0.3 10*3/uL — ABNORMAL HIGH (ref 0.00–0.07)
Basophils Absolute: 0 10*3/uL (ref 0.0–0.1)
Basophils Relative: 0 %
Eosinophils Absolute: 0.5 10*3/uL (ref 0.0–0.5)
Eosinophils Relative: 2 %
HCT: 47.8 % (ref 39.0–52.0)
Hemoglobin: 15.4 g/dL (ref 13.0–17.0)
Lymphocytes Relative: 10 %
Lymphs Abs: 2.6 10*3/uL (ref 0.7–4.0)
MCH: 30.1 pg (ref 26.0–34.0)
MCHC: 32.2 g/dL (ref 30.0–36.0)
MCV: 93.4 fL (ref 80.0–100.0)
Metamyelocytes Relative: 1 %
Monocytes Absolute: 1.5 10*3/uL — ABNORMAL HIGH (ref 0.1–1.0)
Monocytes Relative: 6 %
Neutro Abs: 20.7 10*3/uL — ABNORMAL HIGH (ref 1.7–7.7)
Neutrophils Relative %: 81 %
Platelets: 315 10*3/uL (ref 150–400)
RBC: 5.12 MIL/uL (ref 4.22–5.81)
RDW: 12.9 % (ref 11.5–15.5)
WBC: 25.6 10*3/uL — ABNORMAL HIGH (ref 4.0–10.5)
nRBC: 0 % (ref 0.0–0.2)
nRBC: 0 /100 WBC

## 2021-06-02 LAB — COMPREHENSIVE METABOLIC PANEL
ALT: 21 U/L (ref 0–44)
AST: 22 U/L (ref 15–41)
Albumin: 3.6 g/dL (ref 3.5–5.0)
Alkaline Phosphatase: 82 U/L (ref 38–126)
Anion gap: 13 (ref 5–15)
BUN: 24 mg/dL — ABNORMAL HIGH (ref 8–23)
CO2: 21 mmol/L — ABNORMAL LOW (ref 22–32)
Calcium: 8.4 mg/dL — ABNORMAL LOW (ref 8.9–10.3)
Chloride: 108 mmol/L (ref 98–111)
Creatinine, Ser: 1.41 mg/dL — ABNORMAL HIGH (ref 0.61–1.24)
GFR, Estimated: 56 mL/min — ABNORMAL LOW (ref >60)
Glucose, Bld: 137 mg/dL — ABNORMAL HIGH (ref 70–99)
Potassium: 3.6 mmol/L (ref 3.5–5.1)
Sodium: 142 mmol/L (ref 135–145)
Total Bilirubin: 0.7 mg/dL (ref 0.3–1.2)
Total Protein: 6.4 g/dL — ABNORMAL LOW (ref 6.5–8.1)

## 2021-06-02 LAB — I-STAT ARTERIAL BLOOD GAS, ED
Acid-base deficit: 2 mmol/L (ref 0.0–2.0)
Acid-base deficit: 1 mmol/L (ref 0.0–2.0)
Bicarbonate: 24.9 mmol/L (ref 20.0–28.0)
Bicarbonate: 24.5 mmol/L (ref 20.0–28.0)
Calcium, Ion: 1.17 mmol/L (ref 1.15–1.40)
Calcium, Ion: 1.2 mmol/L (ref 1.15–1.40)
HCT: 44 % (ref 39.0–52.0)
HCT: 37 % — ABNORMAL LOW (ref 39.0–52.0)
Hemoglobin: 15 g/dL (ref 13.0–17.0)
Hemoglobin: 12.6 g/dL — ABNORMAL LOW (ref 13.0–17.0)
O2 Saturation: 100 %
O2 Saturation: 100 %
Patient temperature: 96.8
Patient temperature: 97
Potassium: 3.6 mmol/L (ref 3.5–5.1)
Potassium: 3.9 mmol/L (ref 3.5–5.1)
Sodium: 142 mmol/L (ref 135–145)
Sodium: 141 mmol/L (ref 135–145)
TCO2: 26 mmol/L (ref 22–32)
TCO2: 26 mmol/L (ref 22–32)
pCO2 arterial: 45.5 mmHg (ref 32–48)
pCO2 arterial: 42.5 mmHg (ref 32–48)
pH, Arterial: 7.341 — ABNORMAL LOW (ref 7.35–7.45)
pH, Arterial: 7.364 (ref 7.35–7.45)
pO2, Arterial: 334 mmHg — ABNORMAL HIGH (ref 83–108)
pO2, Arterial: 216 mmHg — ABNORMAL HIGH (ref 83–108)

## 2021-06-02 LAB — RAPID URINE DRUG SCREEN, HOSP PERFORMED
Amphetamines: NOT DETECTED
Barbiturates: NOT DETECTED
Benzodiazepines: POSITIVE — AB
Cocaine: NOT DETECTED
Opiates: POSITIVE — AB
Tetrahydrocannabinol: NOT DETECTED

## 2021-06-02 LAB — URINALYSIS, ROUTINE W REFLEX MICROSCOPIC
Bilirubin Urine: NEGATIVE
Glucose, UA: NEGATIVE mg/dL
Ketones, ur: NEGATIVE mg/dL
Nitrite: NEGATIVE
Protein, ur: 30 mg/dL — AB
Specific Gravity, Urine: 1.018 (ref 1.005–1.030)
WBC, UA: >50 WBC/hpf — ABNORMAL HIGH (ref 0–5)
pH: 6 (ref 5.0–8.0)

## 2021-06-02 LAB — CBC
HCT: 41.6 % (ref 39.0–52.0)
Hemoglobin: 13.4 g/dL (ref 13.0–17.0)
MCH: 29.6 pg (ref 26.0–34.0)
MCHC: 32.2 g/dL (ref 30.0–36.0)
MCV: 91.8 fL (ref 80.0–100.0)
Platelets: 171 10*3/uL (ref 150–400)
RBC: 4.53 MIL/uL (ref 4.22–5.81)
RDW: 13 % (ref 11.5–15.5)
WBC: 11.7 10*3/uL — ABNORMAL HIGH (ref 4.0–10.5)
nRBC: 0 % (ref 0.0–0.2)

## 2021-06-02 LAB — GLUCOSE, CAPILLARY
Glucose-Capillary: 101 mg/dL — ABNORMAL HIGH (ref 70–99)
Glucose-Capillary: 107 mg/dL — ABNORMAL HIGH (ref 70–99)
Glucose-Capillary: 112 mg/dL — ABNORMAL HIGH (ref 70–99)
Glucose-Capillary: 112 mg/dL — ABNORMAL HIGH (ref 70–99)
Glucose-Capillary: 113 mg/dL — ABNORMAL HIGH (ref 70–99)
Glucose-Capillary: 156 mg/dL — ABNORMAL HIGH (ref 70–99)

## 2021-06-02 LAB — PHOSPHORUS: Phosphorus: 3 mg/dL (ref 2.5–4.6)

## 2021-06-02 LAB — BASIC METABOLIC PANEL
Anion gap: 7 (ref 5–15)
BUN: 21 mg/dL (ref 8–23)
CO2: 23 mmol/L (ref 22–32)
Calcium: 8.3 mg/dL — ABNORMAL LOW (ref 8.9–10.3)
Chloride: 109 mmol/L (ref 98–111)
Creatinine, Ser: 1.11 mg/dL (ref 0.61–1.24)
GFR, Estimated: >60 mL/min (ref >60)
Glucose, Bld: 127 mg/dL — ABNORMAL HIGH (ref 70–99)
Potassium: 4 mmol/L (ref 3.5–5.1)
Sodium: 139 mmol/L (ref 135–145)

## 2021-06-02 LAB — TROPONIN I (HIGH SENSITIVITY)
Troponin I (High Sensitivity): 9 ng/L (ref <18)
Troponin I (High Sensitivity): 4 ng/L (ref <18)

## 2021-06-02 LAB — MRSA NEXT GEN BY PCR, NASAL: MRSA by PCR Next Gen: NOT DETECTED

## 2021-06-02 LAB — LACTIC ACID, PLASMA
Lactic Acid, Venous: 4.8 mmol/L (ref 0.5–1.9)
Lactic Acid, Venous: 3 mmol/L (ref 0.5–1.9)

## 2021-06-02 LAB — ETHANOL: Alcohol, Ethyl (B): <10 mg/dL (ref <10)

## 2021-06-02 LAB — MAGNESIUM: Magnesium: 1.8 mg/dL (ref 1.7–2.4)

## 2021-06-02 LAB — AMMONIA: Ammonia: 26 umol/L (ref 9–35)

## 2021-06-02 LAB — HIV ANTIBODY (ROUTINE TESTING W REFLEX): HIV Screen 4th Generation wRfx: NONREACTIVE

## 2021-06-02 MED ORDER — FENTANYL CITRATE PF 50 MCG/ML IJ SOSY: 50.0000 ug | PREFILLED_SYRINGE | INTRAMUSCULAR | Status: DC | PRN

## 2021-06-02 MED ORDER — LACTATED RINGERS IV BOLUS (SEPSIS)
1000.0000 mL | Freq: Once | INTRAVENOUS | Status: AC
  Administered 2021-06-02: 1000 mL via INTRAVENOUS

## 2021-06-02 MED ORDER — DOCUSATE SODIUM 50 MG/5ML PO LIQD: 100.0000 mg | Freq: Two times a day (BID) | ORAL | Status: DC | PRN

## 2021-06-02 MED ORDER — POLYETHYLENE GLYCOL 3350 17 G PO PACK: 17.0000 g | PACK | Freq: Every day | ORAL | Status: DC | PRN

## 2021-06-02 MED ORDER — LACTATED RINGERS IV BOLUS (SEPSIS)
500.0000 mL | Freq: Once | INTRAVENOUS | Status: AC
  Administered 2021-06-02: 500 mL via INTRAVENOUS

## 2021-06-02 MED ORDER — HEPARIN SODIUM (PORCINE) 5000 UNIT/ML IJ SOLN
5000.0000 [IU] | Freq: Three times a day (TID) | INTRAMUSCULAR | Status: DC
  Administered 2021-06-02 – 2021-06-06 (×11): 5000 [IU] via SUBCUTANEOUS
  Filled 2021-06-02 (×11): qty 1

## 2021-06-02 MED ORDER — ORAL CARE MOUTH RINSE
15.0000 mL | Freq: Two times a day (BID) | OROMUCOSAL | Status: DC
  Administered 2021-06-02 – 2021-06-06 (×8): 15 mL via OROMUCOSAL

## 2021-06-02 MED ORDER — PROPOFOL 1000 MG/100ML IV EMUL
5.0000 ug/kg/min | INTRAVENOUS | Status: DC
  Administered 2021-06-02: 35 ug/kg/min via INTRAVENOUS
  Filled 2021-06-02: qty 100

## 2021-06-02 MED ORDER — SODIUM CHLORIDE 0.9 % IV SOLN
2.0000 g | Freq: Once | INTRAVENOUS | Status: AC
  Administered 2021-06-02: 2 g via INTRAVENOUS
  Filled 2021-06-02: qty 12.5

## 2021-06-02 MED ORDER — ORAL CARE MOUTH RINSE
15.0000 mL | OROMUCOSAL | Status: DC
  Administered 2021-06-02 (×5): 15 mL via OROMUCOSAL

## 2021-06-02 MED ORDER — LACTATED RINGERS IV SOLN: INTRAVENOUS | Status: AC

## 2021-06-02 MED ORDER — PROPOFOL 1000 MG/100ML IV EMUL
5.0000 ug/kg/min | INTRAVENOUS | Status: DC
  Administered 2021-06-02: 5 ug/kg/min via INTRAVENOUS

## 2021-06-02 MED ORDER — CEFEPIME HCL 2 G IJ SOLR
2.0000 g | Freq: Two times a day (BID) | INTRAMUSCULAR | Status: DC
  Administered 2021-06-02 (×2): 2 g via INTRAVENOUS
  Filled 2021-06-02 (×2): qty 12.5

## 2021-06-02 MED ORDER — LEVETIRACETAM IN NACL 1500 MG/100ML IV SOLN
1500.0000 mg | Freq: Once | INTRAVENOUS | Status: AC
  Administered 2021-06-02: 1500 mg via INTRAVENOUS
  Filled 2021-06-02: qty 100

## 2021-06-02 MED ORDER — PANTOPRAZOLE 2 MG/ML SUSPENSION
40.0000 mg | Freq: Every day | ORAL | Status: DC
  Administered 2021-06-02: 40 mg
  Filled 2021-06-02 (×2): qty 20

## 2021-06-02 MED ORDER — PROPOFOL 1000 MG/100ML IV EMUL
INTRAVENOUS | Status: AC
Start: 2021-06-02 — End: 2021-06-02
  Filled 2021-06-02: qty 100

## 2021-06-02 MED ORDER — CHLORHEXIDINE GLUCONATE CLOTH 2 % EX PADS
6.0000 | MEDICATED_PAD | Freq: Every day | CUTANEOUS | Status: DC
  Administered 2021-06-02 – 2021-06-03 (×2): 6 via TOPICAL

## 2021-06-02 MED ORDER — FENTANYL CITRATE (PF) 100 MCG/2ML IJ SOLN: 50.0000 ug | INTRAMUSCULAR | Status: DC | PRN

## 2021-06-02 MED ORDER — CHLORHEXIDINE GLUCONATE 0.12% ORAL RINSE (MEDLINE KIT)
15.0000 mL | Freq: Two times a day (BID) | OROMUCOSAL | Status: DC
  Administered 2021-06-02 (×2): 15 mL via OROMUCOSAL

## 2021-06-02 NOTE — ED Notes (Signed)
Report called to receiving RN Jon Gills, RN accepts pt transfer of care, will take pt to CT in route to ICU, RT to assist with transport for ventilator, pt remains on cardiac monitoring for transport.  ?

## 2021-06-02 NOTE — ED Notes (Signed)
Hospitalist at the bedside to assess pt ?

## 2021-06-02 NOTE — ED Notes (Signed)
20 mg etomidate IV push ?

## 2021-06-02 NOTE — ED Notes (Signed)
Epi drip from EMS was d/c ?

## 2021-06-02 NOTE — Progress Notes (Signed)
Pharmacy Antibiotic Note ? ?Derek Sims is a 66 y.o. male admitted on 06/02/2021 with sepsis.  Pharmacy has been consulted for Cefepime dosing. Urine as possible source. WBC elevated. Other labs below. ? ? ?Plan: ?Cefepime 2g IV q12h ?Trend WBC, temp, renal function  ?F/U infectious work-up ? ?Height: 5\' 10"  (177.8 cm) ?Weight: 78.6 kg (173 lb 4.5 oz) ?IBW/kg (Calculated) : 73 ? ?Temp (24hrs), Avg:97.3 ?F (36.3 ?C), Min:96.1 ?F (35.6 ?C), Max:97.5 ?F (36.4 ?C) ? ?Recent Labs  ?Lab 06/02/21 ?0114 06/02/21 ?0332  ?WBC 25.6*  --   ?CREATININE 1.41*  --   ?LATICACIDVEN 4.8* 3.0*  ?  ?Estimated Creatinine Clearance: 54.6 mL/min (A) (by C-G formula based on SCr of 1.41 mg/dL (H)).   ? ?No Known Allergies ? ?06/04/21, PharmD, BCPS ?Clinical Pharmacist ?Phone: [phone] ? ? ?

## 2021-06-02 NOTE — TOC Progression Note (Signed)
Transition of Care (TOC) - Progression Note  ? ? ?Patient Details  ?Name: Derek Sims ?MRN: [id number] ?Date of Birth: May 03, 1955 ? ?Transition of Care (TOC) CM/SW Contact  ?Beckie Busing, RN ?Phone Number:[phone] ? ?06/02/2021, 3:15 PM ? ?Clinical Narrative:    ? ?Transition of Care (TOC) Screening Note ? ? ?Patient Details  ?Name: Derek Sims ?Date of Birth: 08/28/1955 ? ? ?Transition of Care (TOC) CM/SW Contact:    ?Beckie Busing, RN ?Phone Number: ?06/02/2021, 3:16 PM ? ? ? ?Transition of Care Department [facility]) has reviewed patient and no TOC needs have been identified at this time. We will continue to monitor patient advancement through interdisciplinary progression rounds.  ? ? ? ?  ?  ? ?Expected Discharge Plan and Services ?  ?  ?  ?  ?  ?                ?  ?  ?  ?  ?  ?  ?  ?  ?  ?  ? ? ?Social Determinants of Health (SDOH) Interventions ?  ? ?Readmission Risk Interventions ?   ? View : No data to display.  ?  ?  ?  ? ? ?

## 2021-06-02 NOTE — H&P (Signed)
? ?NAME:  Derek Sims, MRN:  [id number], DOB:  1955/07/17, LOS: 0 ?ADMISSION DATE:  06/02/2021, CONSULTATION DATE:  06/02/21 ?REFERRING MD:  Pollina, CHIEF COMPLAINT:  unresponsive  ? ?History of Present Illness:  ?Derek Sims is a 66 y.o. M with unclear PMH as no hx in Epic, per friend he has MS and seizures, but takes no medications for these and does not seek regular medical care.  History taken from chart and ED provider as no family available.   Pt and friend were driving in the car and pt mentioned that his stomach hurt, the friend went into the store to get some Tums and when she returned, he was unresponsive.   When EMS arrived, pt was bradycardic and required external pacing and Epi gtt. ? ?On arrival to the ED, pt was still altered and was intubated.   HR improved and no longer required external pacing or epi gtt.  Work-up revealed WBC 25k, lactic acid 4.8, creatinine 1.4, UA consistent with UTI and UA positive for opiates and benzos (received Versed).   CT head was unremarkable and CXR without acute findings.  PCCM consulted for admission. ? ?Pertinent  Medical History  ? has a past medical history of MS (multiple sclerosis) (HCC) and Seizure (HCC). ? ? ?Significant Hospital Events: ?Including procedures, antibiotic start and stop dates in addition to other pertinent events   ?4/21 Brought in by EMS unresponsive, intubated ? ?Significant Studes ?4/21 CTH>no acute findings ? ? ? ?Micro ?4/21 Bcx2> ?4/21 UC> ?4/21 Covid/Flu> ? ? ?Abx ?Cefepime 4/21- ? ?Interim History / Subjective:  ?As above ? ?Objective   ?Blood pressure 133/81, pulse 77, temperature (!) 97 ?F (36.1 ?C), resp. rate 18, height 5\' 10"  (1.778 m), weight 80 kg, SpO2 100 %. ?   ?Vent Mode: PRVC ?FiO2 (%):  [50 %-100 %] 50 % ?Set Rate:  [18 bmp] 18 bmp ?Vt Set:  [580 mL] 580 mL ?PEEP:  [5 cmH20] 5 cmH20 ?Plateau Pressure:  [16 cmH20-20 cmH20] 16 cmH20  ? ?Intake/Output Summary (Last 24 hours) at 06/02/2021 0415 ?Last data filed at  06/02/2021 0220 ?Gross per 24 hour  ?Intake 150 ml  ?Output --  ?Net 150 ml  ? ?Filed Weights  ? 06/02/21 0116  ?Weight: 80 kg  ? ?General:  elderly critically ill-appearing M intubated and sedated ?HEENT: MM pink/dry, pupils equal and sclera anicteric  ?Neuro: examined on Propofol, RASS -4, spontaneous twitching of the LLE intermittently, PERRLA, no clonus, not responsive to voice or pain ?CV: s1s2 rrr, no m/r/g ?PULM:  clear bilaterally on mechanical ventilation ?GI: soft, non-distended + BS ?Extremities: warm/dry, no edema, no skin mottling  ?Skin: no rashes or lesions ? ? ?Resolved Hospital Problem list   ? ? ?Assessment & Plan:  ? ? ? ?Acute Encephalopathy and AMS ?Respiratory insufficiency ?Pt c/o abdominal pain and then was unresponsive and bradycardic, initially paced and on epi gtt ?-differential includes seizure, OD as UTI +for opiates, sepsis, CVA,   ?-EEG, continue propofol and was loaded with Keppra, may need Neuro consult ?-Maintain full vent support with SAT/SBT as tolerated ?-titrate Vent setting to maintain SpO2 greater than or equal to 90%. ?-HOB elevated 30 degrees. ?-Plateau pressures less than 30 cm H20.  ?-Follow chest x-ray, ABG prn.   ?-Bronchial hygiene and RT/bronchodilator protocol. ? ? ? ? ? ?Sepsis, likely secondary to UTI ?Lactic 4.8, received 2.5L IVF L ?-continue Cefepime and follow cultures ?-pt c/o abdominal pain prior to unresponsiveness, obtain CT abd/pelvis w/o  contrast ?-continue IVF overnight ?-trend lactic acid ? ? ? ? ?Elevated Creatinine, presumed AKI ?Creatinine 1.4 without prior labs ?Likely re-renal volume depletion in the setting of sepsis ?-monitor renal indices and UOP, avoid nephrotoxins as able ? ? ? ? ? ? ?Best Practice (right click and "Reselect all SmartList Selections" daily)  ? ?Diet/type: NPO ?DVT prophylaxis: prophylactic heparin  ?GI prophylaxis: PPI ?Lines: N/A ?Foley:  Yes, and it is still needed ?Code Status:  full code ?Last date of multidisciplinary goals  of care discussion [pending] ? ?Labs   ?CBC: ?Recent Labs  ?Lab 06/02/21 ?0114 06/02/21 ?0204  ?WBC 25.6*  --   ?NEUTROABS 20.7*  --   ?HGB 15.4 15.0  ?HCT 47.8 44.0  ?MCV 93.4  --   ?PLT 315  --   ? ? ?Basic Metabolic Panel: ?Recent Labs  ?Lab 06/02/21 ?0114 06/02/21 ?0204  ?NA 142 142  ?K 3.6 3.6  ?CL 108  --   ?CO2 21*  --   ?GLUCOSE 137*  --   ?BUN 24*  --   ?CREATININE 1.41*  --   ?CALCIUM 8.4*  --   ? ?GFR: ?Estimated Creatinine Clearance: 54.6 mL/min (A) (by C-G formula based on SCr of 1.41 mg/dL (H)). ?Recent Labs  ?Lab 06/02/21 ?0114  ?WBC 25.6*  ?LATICACIDVEN 4.8*  ? ? ?Liver Function Tests: ?Recent Labs  ?Lab 06/02/21 ?0114  ?AST 22  ?ALT 21  ?ALKPHOS 82  ?BILITOT 0.7  ?PROT 6.4*  ?ALBUMIN 3.6  ? ?No results for input(s): LIPASE, AMYLASE in the last 168 hours. ?Recent Labs  ?Lab 06/02/21 ?0114  ?AMMONIA 26  ? ? ?ABG ?   ?Component Value Date/Time  ? PHART 7.341 (L) 06/02/2021 4742  ? PCO2ART 45.5 06/02/2021 0204  ? PO2ART 334 (H) 06/02/2021 5956  ? HCO3 24.9 06/02/2021 0204  ? TCO2 26 06/02/2021 0204  ? ACIDBASEDEF 2.0 06/02/2021 0204  ? O2SAT 100 06/02/2021 0204  ?  ? ?Coagulation Profile: ?No results for input(s): INR, PROTIME in the last 168 hours. ? ?Cardiac Enzymes: ?No results for input(s): CKTOTAL, CKMB, CKMBINDEX, TROPONINI in the last 168 hours. ? ?HbA1C: ?No results found for: HGBA1C ? ?CBG: ?No results for input(s): GLUCAP in the last 168 hours. ? ?Review of Systems:   ?Unable to obtain secondary to encephalopathy ? ?Past Medical History:  ?He,  has a past medical history of MS (multiple sclerosis) (HCC) and Seizure (HCC).  ? ?Surgical History:  ? ? ?Social History:  ?   ? ?Family History:  ?His family history is not on file.  ? ?Allergies ?No Known Allergies  ? ?Home Medications  ?Prior to Admission medications   ?Not on File  ?  ? ?Critical care time: 45 minutes ?  ? ? ? ?CRITICAL CARE ?Performed by: Darcella Gasman Arsalan Brisbin ? ? ?Total critical care time: 45 minutes ? ?Critical care time was exclusive  of separately billable procedures and treating other patients. ? ?Critical care was necessary to treat or prevent imminent or life-threatening deterioration. ? ?Critical care was time spent personally by me on the following activities: development of treatment plan with patient and/or surrogate as well as nursing, discussions with consultants, evaluation of patient's response to treatment, examination of patient, obtaining history from patient or surrogate, ordering and performing treatments and interventions, ordering and review of laboratory studies, ordering and review of radiographic studies, pulse oximetry and re-evaluation of patient's condition. ? ? ? ?Darcella Gasman Talya Quain, PA-C ?[facility] ?See Amion for pager ?  If no response to pager , please call [phone] until 7pm ?After 7:00 pm call Elink  [phone] ? ? ? ?

## 2021-06-02 NOTE — Progress Notes (Addendum)
? ?NAME:  Derek Sims, MRN:  [id number], DOB:  [birth year], LOS: 0 ?ADMISSION DATE:  06/02/2021, CONSULTATION DATE:  06/02/21 ?REFERRING MD:  Pollina, CHIEF COMPLAINT:  unresponsive  ? ?History of Present Illness:  ?66 y.o. M with unclear PMH as no hx in Epic, per friend he has MS and seizures, but takes no medications for these and does not seek regular medical care.  History taken from chart and ED provider as no family available.   Pt and friend were driving in the car and pt mentioned that his stomach hurt, the friend went into the store to get some Tums and when she returned, he was unresponsive.   When EMS arrived, pt was bradycardic and required external pacing and Epi gtt. ? ?On arrival to the ED, pt was still altered and was intubated.   HR improved and no longer required external pacing or epi gtt.  Work-up revealed WBC 25k, lactic acid 4.8, creatinine 1.4, UA consistent with UTI and UA positive for opiates and benzos (received Versed).   CT head was unremarkable and CXR without acute findings.  PCCM consulted for admission. ? ?Pertinent  Medical History  ?Multiple sclerosis  ?Seizure  ? ?Significant Hospital Events: ?Including procedures, antibiotic start and stop dates in addition to other pertinent events   ?4/21 Brought in by EMS unresponsive, intubated, possible seizure.  CTH negative.  Cultures pending. Started on cefepime ? ?Interim History / Subjective:  ?Afebrile  ?Micro pending ?Vent - 40%, PEEP 5  ?I/O 450 ml UOP since admit  ? ?Sister works at [employer] in the [employer].  Reports he is divorced, children live in [place] (daughter is Shanda Bumps).  Urine bag got a hole in it and he has been using the same one.  [place] had delivered new bags to him last night.  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  He lives alone, uses wheelchair / not ambulatory.  He does not drive.  Followed by an MD in [facility].  ? ?Objective   ?Blood pressure 101/63, pulse 74, temperature (!) 97.3 ?F (36.3 ?C), resp.  rate 18, height 5\' 10"  (1.778 m), weight 78.6 kg, SpO2 99 %. ?   ?Vent Mode: PRVC ?FiO2 (%):  [40 %-100 %] 40 % ?Set Rate:  [18 bmp] 18 bmp ?Vt Set:  [580 mL] 580 mL ?PEEP:  [5 cmH20] 5 cmH20 ?Plateau Pressure:  [16 cmH20-20 cmH20] 16 cmH20  ? ?Intake/Output Summary (Last 24 hours) at 06/02/2021 0740 ?Last data filed at 06/02/2021 0607 ?Gross per 24 hour  ?Intake 592.08 ml  ?Output 450 ml  ?Net 142.08 ml  ? ?Filed Weights  ? 06/02/21 0116 06/02/21 0522  ?Weight: 80 kg 78.6 kg  ? ?Exam: ?General: adult male lying in bed on vent in NAD ?HEENT: MM pink/moist, ETT, anicteric, pupils 72mm reactive  ?Neuro: sedate, awakens to voice on WUA, follows commands / MAE ?CV: s1s2 RRR, no m/r/g ?PULM: non-labored at rest, lungs bilaterally clear  ?GI: soft, bsx4 active  ?Extremities: warm/dry, no edema  ?Skin: no rashes or lesions ? ?Resolved Hospital Problem list   ? ? ?Assessment & Plan:  ? ?Sepsis, suspect secondary to UTI ?Chronic Indwelling Foley  ?Pt c/o bdominal pain and then was unresponsive and bradycardic, initially paced and on epi gtt. Lactic 4.8, received 2.5L IVF L, cleared to 3. CT abd/pelvis with 5mm non-obs strone in L kidney, indeterminate lesion in the lower pole of right kidney, right sided bladder diverticulum.  Sister  reports he has chronic indwelling catheter with leg bag.  ?-hemodynamically stable  ?-continue cefepime  ?-follow cultures  ?-LR at 170ml/hr  ?-will need outpatient Urology evaluation  ? ?Acute Hypoxic Respiratory Failure secondary to Sepsis  ?Pt c/o a-differential includes seizure, OD as UTI +for opiates, sepsis, CVA,   ?-PRVC 8cc/kg  ?-wean PEEP / fio2 for sats >90% ?-SBT / WUA with goal for extubation  ?-aspiration / VAP prevention precautions  ?-follow intermittent CXR ? ?Acute Metabolic Encephalopathy  ?DDx includes sepsis, toxic, +opiates on UDS, seizure ?-assess EEG ?-propofol for sedation  ?-continue keppra, may need Neuro evaluation  ?-PT efforts when able  ? ?AKI ?Creatinine 1.4 without  prior labs, improved to 1.1 with IVF ?-continue IVF  ?-Trend BMP / urinary output ?-Replace electrolytes as indicated ?-Avoid nephrotoxic agents, ensure adequate renal perfusion ? ?Decubitus Ulcer ?-wound care per nursing ? ?MS  ?Wheelchair bound at baseline  ?-supportive care  ? ? ?Best Practice (right click and "Reselect all SmartList Selections" daily)  ?Diet/type: NPO ?DVT prophylaxis: prophylactic heparin  ?GI prophylaxis: PPI ?Lines: N/A ?Foley:  Yes, and it is still needed ?Code Status:  full code ?Last date of multidisciplinary goals of care discussion: full code. Sister updated in detail on plan of care 4/21.   ? ? ?Critical care time: 36 minutes  ? ?Canary Brim, MSN, APRN, NP-C, AGACNP-BC ?[facility] ?06/02/2021, 7:40 AM ? ? ?Please see [email or web address] for pager details.  ? ?From 7A-7P if no response, please call [phone] ?After hours, please call Pola Corn [phone] ? ? ? ?

## 2021-06-02 NOTE — ED Notes (Signed)
Pt resting in bed, calm, remains intubated, NAD noted, handling vent well, VSS, NSR on monitor. Even & unlabored RR, skin dry and cool, Safety in place, pt within view of staff for continuous monitoring. POC on going.  ?

## 2021-06-02 NOTE — Progress Notes (Signed)
Pt transported from ED [facility] to 2M15 by RNx2 and RT w/ no complications ?

## 2021-06-02 NOTE — ED Notes (Signed)
Patient transported to CT with RN Marylu Lund and RT  ?

## 2021-06-02 NOTE — Progress Notes (Signed)
Pt arrives from ER with the following personal belongings: ?-1 brown wallet with yellow tape: with 3$, ID card ?- 1 pair of brown sandals ?- 1 eye glass ?- 1 purple/blue sweater ?- 2 socks ?

## 2021-06-02 NOTE — Progress Notes (Signed)
EEG complete - results pending 

## 2021-06-02 NOTE — Sepsis Progress Note (Addendum)
Monitoring for the code sepsis protocol.  Blood cultures drawn 2 hours before code sepsis ordered.  Antibiotics not given at time of order. ?

## 2021-06-02 NOTE — Progress Notes (Signed)
eLink Physician-Brief Progress Note ?Patient Name: Derek Sims ?DOB: [birth year] ?MRN: [id number] ? ? ?Date of Service ? 06/02/2021  ?HPI/Events of Note ? Patient admitted with altered mental status, acute hypoxemic / hypercapnic respiratory failure, septic shock of possible urinary tract origin, symptomatic bradycardia that required extremal pacing / Epinephrine gtt. All this against a background history of multiple sclerosis, and possible narcotic overdose. Seizure also needs to be ruled out. Work up is in progress.  ?eICU Interventions ? New Patient Evaluation.  ? ? ? ?  ? ?Thomasene Lot Khaidyn Staebell ?06/02/2021, 5:41 AM ?

## 2021-06-02 NOTE — ED Triage Notes (Addendum)
Pt arrived via [facility] unresponsive, assisted breathing by BVM, pt was initially paced at sustained rate of 70 on demand, upon arrival pt had HR of 80, non-pacing. ? ?2.5 versed given per EMS ? ?EMS called out for syncopal episode while sitting in car, when EMS arrived pt was GCS of 15, able to use BR while on scene by self.  ? ?In route pt became unresponsive and HR dropped into the 30s, pacing began. SBP 80 palpated.  ?

## 2021-06-02 NOTE — ED Notes (Signed)
Pt intubated successfully by Dr. Blinda Leatherwood, RT at the bedside to assist with vent settings  ?

## 2021-06-02 NOTE — ED Notes (Signed)
Friend placed in conference room by security, MD Pollina notified as family friend would like an update, no family present at this time  ?

## 2021-06-02 NOTE — Progress Notes (Signed)
Pt transported to CT2 from [facility] and back by RN and RT with no complications ?

## 2021-06-02 NOTE — Procedures (Addendum)
Extubation Procedure Note ? ?Patient Details:   ?Name: Derek Sims ?DOB: Apr 20, 1955 ?MRN: [id number] ?  ?Airway Documentation:  ?  ?Vent end date: 06/02/21 Vent end time: 1140  ? ?Evaluation ? O2 sats: stable throughout ?Complications: No apparent complications ?Patient did tolerate procedure well. ?Bilateral Breath Sounds: Clear, Diminished ?  ?Yes ? ?Patient extubated to 2L [place] without complications. Patient had a positive cuff leak prior to extubation. No stridor noted at this time. Patient able to speak.  ? ?Derek Sims ?06/02/2021, 11:42 AM ? ?

## 2021-06-02 NOTE — Progress Notes (Signed)
Patient seen, independently examined, care plan was formulated and discussed with Canary Brim as per documentation ? ?Patient with seizure disorder, multiple sclerosis, brought into the emergency department with unresponsiveness ?Did have bradycardia, hypotension initially ? ?Middle-age gentleman, does not appear to be in distress ?On vent ?Clear breath sounds ?Bowel sounds appreciated ? ?Following commands ? ?Labs reviewed ? ?Assessment/plan: ?Acute hypoxemic respiratory failure ?Encephalopathy ?Sepsis secondary to urinary tract infection ?Acute kidney injury ?Seizure disorder ?Background history of multiple sclerosis ? ?He is weaning well at the present time ?-Continue weaning as tolerated and will plan to extubate if mental status continues to be stable ?-Appears to be pulling good volumes at present ? ?Sepsis ?-Continue antibiotics currently ?-Currently on cefepime ? ?Metabolic encephalopathy ?-Wean sedation ?-UDS positive for opiates ? ?Acute kidney injury ?-Avoid nephrotoxic medications ?-Trend electrolytes ?-Maintain renal perfusion ? ?Multiple sclerosis ?-In wheelchair at baseline ? ?Goal will be to wean, extubate ?The patient is critically ill with multiple organ systems failure and requires high complexity decision making for assessment and support, frequent evaluation and titration of therapies, application of advanced monitoring technologies and extensive interpretation of multiple databases. Critical Care Time devoted to patient care services described in this note independent of APP/resident time (if applicable)  is 32 minutes.  ? ?Virl Diamond MD ?[facility] ?Personal pager: See Loretha Stapler ?If unanswered, please page ?CCM On-call: #[phone] ? ? ?

## 2021-06-02 NOTE — ED Notes (Signed)
RT at the bedside for ABG.  

## 2021-06-02 NOTE — ED Notes (Signed)
100 mg  succinylcholine IV push ?

## 2021-06-02 NOTE — Procedures (Signed)
Patient Name: Derek Sims  ?MRN: [id number]  ?Epilepsy Attending: Charlsie Quest  ?Referring Physician/Provider: Gleason, Darcella Gasman, PA-C ?Date: 06/02/2021 ?Duration: 21.57 mins ? ?Patient history: 66 year old male with altered mental status.  EEG evaluate for seizure. ? ?Level of alertness: comatose ? ?AEDs during EEG study: Propofol ? ?Technical aspects: This EEG study was done with scalp electrodes positioned according to the 10-20 International system of electrode placement. Electrical activity was acquired at a sampling rate of 500Hz  and reviewed with a high frequency filter of 70Hz  and a low frequency filter of 1Hz . EEG data were recorded continuously and digitally stored.  ? ?Description: EEG showed continuous generalized 3 to 6 Hz theta - delta slowing with overriding 15 to 18 Hz generalized beta activity. Hyperventilation and photic stimulation were not performed.    ? ?ABNORMALITY ?- Continuous slow, generalized ? ?IMPRESSION: ?This study is  suggestive of severe diffuse encephalopathy, nonspecific etiology but could be related to sedation. No seizures or epileptiform discharges were seen throughout the recording. ? ?  ? ?

## 2021-06-02 NOTE — Progress Notes (Signed)
RT NOTE: RT advanced ETT 1 cm to 24 at the lips per CCM per chest xray.  ?

## 2021-06-02 NOTE — ED Provider Notes (Signed)
?[facility] EMERGENCY DEPARTMENT ?Provider Note ? ? ?CSN: [id number] ?Arrival date & time: 06/02/21  0103 ? ?  ? ?History ? ?Chief Complaint  ?Patient presents with  ? Unresponsive  ? ? ?Derek Sims is a 66 y.o. male. ? ?Patient brought to the emergency department by EMS.  Information provided by the patient's friend who witnessed his collapse.  Friend reports that they were driving in her car and stopped at a store.  He started to say that his stomach hurt and he needed a Tums.  She went in the store to get the Tums and when she returned he was confused and altered.  He seemed to be going in and out of consciousness.  EMS was called and by the time they responded to the scene, he was awake alert and oriented.  He then had a syncopal episode at which time he noted his heart rate was in the 30s.  Patient was externally paced for a period of time.  He was given 2.5 mg of Versed for the pacing.  He was placed on an epi drip.  Respirations have been assisted by bag-valve-mask during transport.  At arrival to the ER he is mostly unresponsive. ? ? ?  ? ?Home Medications ?Prior to Admission medications   ?Not on File  ?   ? ?Allergies    ?Patient has no known allergies.   ? ?Review of Systems   ?Review of Systems ? ?Physical Exam ?Updated Vital Signs ?BP 112/64   Pulse 86   Temp 98 ?F (36.7 ?C) (Oral)   Resp (!) 21   Ht  (1.778 m)   Wt 78.1 kg   SpO2 96%   BMI 24.71 kg/m?  ?Physical Exam ?Vitals and nursing note reviewed.  ?Constitutional:   ?   General: He is in acute distress.  ?   Appearance: He is well-developed.  ?HENT:  ?   Head: Normocephalic and atraumatic.  ?   Mouth/Throat:  ?   Mouth: Mucous membranes are moist.  ?Eyes:  ?   General: Vision grossly intact. Gaze aligned appropriately.  ?   Conjunctiva/sclera: Conjunctivae normal.  ?Cardiovascular:  ?   Rate and Rhythm: Normal rate and regular rhythm.  ?   Pulses: Normal pulses.  ?   Heart sounds: Normal heart sounds, S1  normal and S2 normal. No murmur heard. ?  No friction rub. No gallop.  ?Pulmonary:  ?   Effort: No respiratory distress.  ?   Breath sounds: Normal breath sounds. Decreased air movement present.  ?Abdominal:  ?   Palpations: Abdomen is soft.  ?   Tenderness: There is no abdominal tenderness. There is no guarding or rebound.  ?   Hernia: No hernia is present.  ?Musculoskeletal:     ?   General: No swelling.  ?   Cervical back: Full passive range of motion without pain, normal range of motion and neck supple. No pain with movement, spinous process tenderness or muscular tenderness. Normal range of motion.  ?   Right lower leg: No edema.  ?   Left lower leg: No edema.  ?Skin: ?   General: Skin is warm and dry.  ?   Capillary Refill: Capillary refill takes less than 2 seconds.  ?   Findings: No ecchymosis, erythema, lesion or wound.  ?Neurological:  ?   GCS: GCS eye subscore is 1. GCS verbal subscore is 1. GCS motor subscore is 4.  ?   Motor:  No weakness or abnormal muscle tone.  ? ? ?ED Results / Procedures / Treatments   ?Labs ?(all labs ordered are listed, but only abnormal results are displayed) ?Labs Reviewed  ?RAPID URINE DRUG SCREEN, HOSP PERFORMED - Abnormal; Notable for the following components:  ?    Result Value  ? Opiates POSITIVE (*)   ? Benzodiazepines POSITIVE (*)   ? All other components within normal limits  ?CBC WITH DIFFERENTIAL/PLATELET - Abnormal; Notable for the following components:  ? WBC 25.6 (*)   ? Neutro Abs 20.7 (*)   ? Monocytes Absolute 1.5 (*)   ? Abs Immature Granulocytes 0.30 (*)   ? All other components within normal limits  ?COMPREHENSIVE METABOLIC PANEL - Abnormal; Notable for the following components:  ? CO2 21 (*)   ? Glucose, Bld 137 (*)   ? BUN 24 (*)   ? Creatinine, Ser 1.41 (*)   ? Calcium 8.4 (*)   ? Total Protein 6.4 (*)   ? GFR, Estimated 56 (*)   ? All other components within normal limits  ?LACTIC ACID, PLASMA - Abnormal; Notable for the following components:  ? Lactic  Acid, Venous 4.8 (*)   ? All other components within normal limits  ?LACTIC ACID, PLASMA - Abnormal; Notable for the following components:  ? Lactic Acid, Venous 3.0 (*)   ? All other components within normal limits  ?URINALYSIS, ROUTINE W REFLEX MICROSCOPIC - Abnormal; Notable for the following components:  ? APPearance CLOUDY (*)   ? Hgb urine dipstick MODERATE (*)   ? Protein, ur 30 (*)   ? Leukocytes,Ua LARGE (*)   ? WBC, UA >50 (*)   ? Bacteria, UA RARE (*)   ? All other components within normal limits  ?CBC - Abnormal; Notable for the following components:  ? WBC 11.7 (*)   ? All other components within normal limits  ?BASIC METABOLIC PANEL - Abnormal; Notable for the following components:  ? Glucose, Bld 127 (*)   ? Calcium 8.3 (*)   ? All other components within normal limits  ?GLUCOSE, CAPILLARY - Abnormal; Notable for the following components:  ? Glucose-Capillary 112 (*)   ? All other components within normal limits  ?GLUCOSE, CAPILLARY - Abnormal; Notable for the following components:  ? Glucose-Capillary 112 (*)   ? All other components within normal limits  ?GLUCOSE, CAPILLARY - Abnormal; Notable for the following components:  ? Glucose-Capillary 113 (*)   ? All other components within normal limits  ?GLUCOSE, CAPILLARY - Abnormal; Notable for the following components:  ? Glucose-Capillary 101 (*)   ? All other components within normal limits  ?COMPREHENSIVE METABOLIC PANEL - Abnormal; Notable for the following components:  ? Glucose, Bld 106 (*)   ? Calcium 8.6 (*)   ? Total Protein 5.6 (*)   ? Albumin 3.1 (*)   ? All other components within normal limits  ?GLUCOSE, CAPILLARY - Abnormal; Notable for the following components:  ? Glucose-Capillary 156 (*)   ? All other components within normal limits  ?GLUCOSE, CAPILLARY - Abnormal; Notable for the following components:  ? Glucose-Capillary 107 (*)   ? All other components within normal limits  ?GLUCOSE, CAPILLARY - Abnormal; Notable for the following  components:  ? Glucose-Capillary 104 (*)   ? All other components within normal limits  ?GLUCOSE, CAPILLARY - Abnormal; Notable for the following components:  ? Glucose-Capillary 101 (*)   ? All other components within normal limits  ?I-STAT ARTERIAL BLOOD GAS, ED -  Abnormal; Notable for the following components:  ? pH, Arterial 7.341 (*)   ? pO2, Arterial 334 (*)   ? All other components within normal limits  ?I-STAT ARTERIAL BLOOD GAS, ED - Abnormal; Notable for the following components:  ? pO2, Arterial 216 (*)   ? HCT 37.0 (*)   ? Hemoglobin 12.6 (*)   ? All other components within normal limits  ?MRSA NEXT GEN BY PCR, NASAL  ?URINE CULTURE  ?CULTURE, BLOOD (SINGLE)  ?CULTURE, BLOOD (SINGLE)  ?ETHANOL  ?AMMONIA  ?HIV ANTIBODY (ROUTINE TESTING W REFLEX)  ?MAGNESIUM  ?PHOSPHORUS  ?CBC  ?TROPONIN I (HIGH SENSITIVITY)  ?TROPONIN I (HIGH SENSITIVITY)  ? ? ?EKG ?None ? ?Radiology ?CT ABDOMEN PELVIS WO CONTRAST ? ?Result Date: 06/02/2021 ?CLINICAL DATA:  66 year old male with history of acute onset of nonlocalized abdominal pain. EXAM: CT ABDOMEN AND PELVIS WITHOUT CONTRAST TECHNIQUE: Multidetector CT imaging of the abdomen and pelvis was performed following the standard protocol without IV contrast. RADIATION DOSE REDUCTION: This exam was performed according to the departmental dose-optimization program which includes automated exposure control, adjustment of the mA and/or kV according to patient size and/or use of iterative reconstruction technique. COMPARISON:  No priors. FINDINGS: Lower chest: Mild scarring in the lung bases bilaterally (right greater than left). Atherosclerotic calcifications in the descending thoracic aorta as well as the left anterior descending coronary artery. Mild calcifications of the aortic valve and mitral annulus. Nasogastric tube in the distal esophagus extending into the stomach. Hepatobiliary: No definite suspicious cystic or solid hepatic lesions are confidently identified on today's  noncontrast CT examination. Small calcified gallstones lying dependently in the gallbladder measuring 9 mm or less in size. Gallbladder is nearly decompressed. No pericholecystic fluid or surrounding inflammatory cha

## 2021-06-03 ENCOUNTER — Inpatient Hospital Stay (HOSPITAL_COMMUNITY): Payer: Medicare Other

## 2021-06-03 ENCOUNTER — Encounter (HOSPITAL_COMMUNITY): Payer: Self-pay | Admitting: Internal Medicine

## 2021-06-03 ENCOUNTER — Other Ambulatory Visit: Payer: Self-pay

## 2021-06-03 LAB — GLUCOSE, CAPILLARY
Glucose-Capillary: 104 mg/dL — ABNORMAL HIGH (ref 70–99)
Glucose-Capillary: 101 mg/dL — ABNORMAL HIGH (ref 70–99)

## 2021-06-03 LAB — COMPREHENSIVE METABOLIC PANEL
ALT: 18 U/L (ref 0–44)
AST: 20 U/L (ref 15–41)
Albumin: 3.1 g/dL — ABNORMAL LOW (ref 3.5–5.0)
Alkaline Phosphatase: 73 U/L (ref 38–126)
Anion gap: 7 (ref 5–15)
BUN: 13 mg/dL (ref 8–23)
CO2: 24 mmol/L (ref 22–32)
Calcium: 8.6 mg/dL — ABNORMAL LOW (ref 8.9–10.3)
Chloride: 109 mmol/L (ref 98–111)
Creatinine, Ser: 0.98 mg/dL (ref 0.61–1.24)
GFR, Estimated: >60 mL/min (ref >60)
Glucose, Bld: 106 mg/dL — ABNORMAL HIGH (ref 70–99)
Potassium: 3.5 mmol/L (ref 3.5–5.1)
Sodium: 140 mmol/L (ref 135–145)
Total Bilirubin: 0.9 mg/dL (ref 0.3–1.2)
Total Protein: 5.6 g/dL — ABNORMAL LOW (ref 6.5–8.1)

## 2021-06-03 LAB — CBC
HCT: 40.6 % (ref 39.0–52.0)
Hemoglobin: 13.7 g/dL (ref 13.0–17.0)
MCH: 30.4 pg (ref 26.0–34.0)
MCHC: 33.7 g/dL (ref 30.0–36.0)
MCV: 90 fL (ref 80.0–100.0)
Platelets: 155 10*3/uL (ref 150–400)
RBC: 4.51 MIL/uL (ref 4.22–5.81)
RDW: 12.9 % (ref 11.5–15.5)
WBC: 8.5 10*3/uL (ref 4.0–10.5)
nRBC: 0 % (ref 0.0–0.2)

## 2021-06-03 MED ORDER — SODIUM CHLORIDE 0.9 % IV SOLN
1.0000 g | INTRAVENOUS | Status: DC
  Administered 2021-06-03 – 2021-06-05 (×3): 1 g via INTRAVENOUS
  Filled 2021-06-03 (×3): qty 10

## 2021-06-03 MED ORDER — BACLOFEN 10 MG PO TABS
5.0000 mg | ORAL_TABLET | Freq: Three times a day (TID) | ORAL | Status: DC
  Administered 2021-06-03 – 2021-06-04 (×2): 5 mg via ORAL
  Filled 2021-06-03 (×4): qty 1

## 2021-06-03 NOTE — Progress Notes (Signed)
Patient sent with the following patient belongings to 5W16 ?- Glasses ?- Phone & white phone charger ?- Wallet with $2 & multiple credit cards & ID's ?- 1 pack of white socks ?- Pants ?- Pair of brown shoes.  ?

## 2021-06-03 NOTE — Progress Notes (Signed)
Received by transfer to admit to rm 16 via bed awake alert/oriented x 3. Reports mild pain right foot 2/10 states he has this occasionally denies needing any pain medication. Reports feeling drowsy and ok now ?

## 2021-06-03 NOTE — Progress Notes (Signed)
Notified of leg discomfort, legs bothering him ? ?He was not on a muscle relaxer at home ? ?Baclofen low-dose 5 mg 3 times daily ordered-May need to be increased ?

## 2021-06-03 NOTE — Progress Notes (Signed)
? ?NAME:  Derek Sims, MRN:  [id number], DOB:  [birth year], LOS: 1 ?ADMISSION DATE:  06/02/2021, CONSULTATION DATE:  06/03/21 ?REFERRING MD:  Pollina, CHIEF COMPLAINT:  unresponsive  ? ?History of Present Illness:  ?66 y.o. M with unclear PMH as no hx in Epic, per friend he has MS and seizures, but takes no medications for these and does not seek regular medical care.  History taken from chart and ED provider as no family available.   Pt and friend were driving in the car and pt mentioned that his stomach hurt, the friend went into the store to get some Tums and when she returned, he was unresponsive.   When EMS arrived, pt was bradycardic and required external pacing and Epi gtt. ? ?On arrival to the ED, pt was still altered and was intubated.   HR improved and no longer required external pacing or epi gtt.  Work-up revealed WBC 25k, lactic acid 4.8, creatinine 1.4, UA consistent with UTI and UA positive for opiates and benzos (received Versed).   CT head was unremarkable and CXR without acute findings.  PCCM consulted for admission. ? ?Pertinent  Medical History  ?Multiple sclerosis  ?Seizure  ? ?Significant Hospital Events: ?Including procedures, antibiotic start and stop dates in addition to other pertinent events   ?4/21 Brought in by EMS unresponsive, intubated, possible seizure.  CTH negative.  Cultures pending. Started on cefepime ?4/22 extubated 4/21, confusion ? ?Interim History / Subjective:  ?No overnight events ?Some confusion-patient trying to pull things off through the night ? ?Sister works at [employer] in the [employer].  Reports he is divorced, children live in [place] (daughter is Shanda Bumps).  Urine bag got a hole in it and he has been using the same one.  [employer] had delivered new bags to him last night.  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  He lives alone, uses wheelchair / not ambulatory.  He does not drive.  Followed by an MD in [facility].  ? ?Objective   ?Blood pressure 112/64, pulse  86, temperature 98 ?F (36.7 ?C), temperature source Oral, resp. rate (!) 21, height 5\' 10"  (1.778 m), weight 78.1 kg, SpO2 96 %. ?   ?Vent Mode: PSV;CPAP ?FiO2 (%):  [40 %] 40 % ?PEEP:  [5 cmH20] 5 cmH20 ?Pressure Support:  [5 cmH20] 5 cmH20  ? ?Intake/Output Summary (Last 24 hours) at 06/03/2021 0740 ?Last data filed at 06/03/2021 06/05/2021 ?Gross per 24 hour  ?Intake 1657.29 ml  ?Output 2170 ml  ?Net -512.71 ml  ? ?Filed Weights  ? 06/02/21 0116 06/02/21 0522 06/03/21 0500  ?Weight: 80 kg 78.6 kg 78.1 kg  ? ?Exam: ?General: Middle-aged, does not appear to be in distress ?HEENT: Moist oral mucosa ?Neuro: Awake alert interactive ?CV: S1-S2 appreciated with no murmur ?PULM: Clear breath sounds bilaterally ?GI: soft, bsx4 active  ?Extremities: Skin is warm and dry ?Skin: no rashes or lesions ? ?Resolved Hospital Problem list   ? ? ?Assessment & Plan:  ? ?Sepsis ?Secondary to UTI ?He does wear a condom catheter secondary to incontinence- ?-Stable hemodynamics ?-Cefepime day 2 ?-Blood culture negative ?-Urine culture pending ?-Transition to Rocephin ? ?Acute hypoxemic respiratory failure secondary to sepsis ?-Resolved ?-Extubated 4/21 2023 ? ?Acute metabolic encephalopathy ?-Likely related to sepsis ?-U tox was positive for opiates ?-Off all sedation at present ? ?Acute kidney injury ?-Creatinine was 1.4 prior ?-This appears to be improving ? ?Decubitus ulcer ?-Wound care per nursing ? ?Multiple sclerosis ?-  Wheelchair-bound at baseline ?-Supportive care ? ? ?It was able to provide information yesterday by does wear a leg bag for incontinence related to his multiple sclerosis ?Uses a condom catheter ? ?We will transfer to medical floor today ?-Needs ongoing treatment for urinary tract infection till cultures available ?- ?Best Practice (right click and "Reselect all SmartList Selections" daily)  ?Diet/type: Regular consistency (see orders) ?DVT prophylaxis: prophylactic heparin  ?GI prophylaxis: N/A ?Lines: N/A ?Foley:  N/A,  wears a condom catheter ?Code Status:  full code ?Last date of multidisciplinary goals of care discussion: full code. Sister updated in detail on plan of care 4/21.   ? ?Virl Diamond, MD ?[facility] ?Pager: See Amion ? ? ? ?

## 2021-06-04 DIAGNOSIS — G35 Multiple sclerosis: Secondary | ICD-10-CM | POA: Diagnosis present

## 2021-06-04 DIAGNOSIS — J9600 Acute respiratory failure, unspecified whether with hypoxia or hypercapnia: Secondary | ICD-10-CM | POA: Diagnosis not present

## 2021-06-04 DIAGNOSIS — N390 Urinary tract infection, site not specified: Secondary | ICD-10-CM | POA: Diagnosis not present

## 2021-06-04 DIAGNOSIS — A419 Sepsis, unspecified organism: Secondary | ICD-10-CM | POA: Diagnosis not present

## 2021-06-04 LAB — CBC
HCT: 41.4 % (ref 39.0–52.0)
Hemoglobin: 13.5 g/dL (ref 13.0–17.0)
MCH: 29.2 pg (ref 26.0–34.0)
MCHC: 32.6 g/dL (ref 30.0–36.0)
MCV: 89.4 fL (ref 80.0–100.0)
Platelets: 170 10*3/uL (ref 150–400)
RBC: 4.63 MIL/uL (ref 4.22–5.81)
RDW: 12.6 % (ref 11.5–15.5)
WBC: 8.7 10*3/uL (ref 4.0–10.5)
nRBC: 0 % (ref 0.0–0.2)

## 2021-06-04 LAB — BASIC METABOLIC PANEL
Anion gap: 9 (ref 5–15)
BUN: 10 mg/dL (ref 8–23)
CO2: 23 mmol/L (ref 22–32)
Calcium: 8.7 mg/dL — ABNORMAL LOW (ref 8.9–10.3)
Chloride: 108 mmol/L (ref 98–111)
Creatinine, Ser: 0.96 mg/dL (ref 0.61–1.24)
GFR, Estimated: >60 mL/min (ref >60)
Glucose, Bld: 95 mg/dL (ref 70–99)
Potassium: 3.4 mmol/L — ABNORMAL LOW (ref 3.5–5.1)
Sodium: 140 mmol/L (ref 135–145)

## 2021-06-04 LAB — URINE CULTURE: Culture: >=100000 — AB

## 2021-06-04 LAB — MAGNESIUM: Magnesium: 1.8 mg/dL (ref 1.7–2.4)

## 2021-06-04 MED ORDER — NICOTINE 21 MG/24HR TD PT24
21.0000 mg | MEDICATED_PATCH | Freq: Every day | TRANSDERMAL | Status: DC
  Administered 2021-06-04 – 2021-06-06 (×3): 21 mg via TRANSDERMAL
  Filled 2021-06-04 (×3): qty 1

## 2021-06-04 MED ORDER — FENTANYL CITRATE PF 50 MCG/ML IJ SOSY
25.0000 ug | PREFILLED_SYRINGE | INTRAMUSCULAR | Status: DC | PRN
Start: 2021-06-04 — End: 2021-06-04

## 2021-06-04 MED ORDER — MELATONIN 3 MG PO TABS
3.0000 mg | ORAL_TABLET | Freq: Every evening | ORAL | Status: DC | PRN
  Administered 2021-06-04 – 2021-06-05 (×3): 3 mg via ORAL
  Filled 2021-06-04 (×3): qty 1

## 2021-06-04 MED ORDER — ACETAMINOPHEN 325 MG PO TABS
650.0000 mg | ORAL_TABLET | Freq: Four times a day (QID) | ORAL | Status: DC | PRN
Start: 2021-06-04 — End: 2021-06-06
  Administered 2021-06-04 (×2): 650 mg via ORAL
  Filled 2021-06-04 (×2): qty 2

## 2021-06-04 MED ORDER — NALOXONE HCL 0.4 MG/ML IJ SOLN
0.4000 mg | INTRAMUSCULAR | Status: DC | PRN
Start: 2021-06-04 — End: 2021-06-04

## 2021-06-04 MED ORDER — POTASSIUM CHLORIDE CRYS ER 20 MEQ PO TBCR
40.0000 meq | EXTENDED_RELEASE_TABLET | Freq: Once | ORAL | Status: AC
  Administered 2021-06-04: 40 meq via ORAL
  Filled 2021-06-04: qty 2

## 2021-06-04 NOTE — Evaluation (Signed)
Occupational Therapy Evaluation ?Patient Details ?Name: Derek Sims ?MRN: [id number] ?DOB: 06-12-1955 ?Today's Date: 06/04/2021 ? ? ?History of Present Illness 66 y.o. M  Pt and friend were driving in the car and pt mentioned that his stomach hurt, the friend went into the store to get some Tums and when she returned, he was unresponsive.   When EMS arrived, pt was bradycardic and required external pacing and Epi gtt; was altered and intubated; Sepsis with UTI, Acute hypoxemic respiratory failure secondary to sepsis; extubated 4/21, and transferred to 5W; uses a wheelchair for mobility; PMH: per friend he has MS and seizures, but takes no medications for these and does not seek regular medical care.  ? ?Clinical Impression ?  ?Clide Cliff was evaluated s/p the above admission list. He is generally mod I at baseline, he mobilizes at wc level and transfers indep. He also reports mod I with ADLs and has friends near by who can assist as needed. Upon evaluation pt was limited by impaired cognition, BLE weakness, poor insight to safety and deficits, impulsivity and decreased activity tolerance. Overall he was min A for bed mobility, and mod A for laterally scooting along the bed side. Due to limitations listed below, pt requires set up for UB ADLs and up to max A for LB ADLs. He will benefit from OT acutely. Recommend d/c to AIR for maximal functional recovery towards his mod I baseline.   ? ?Recommendations for follow up therapy are one component of a multi-disciplinary discharge planning process, led by the attending physician.  Recommendations may be updated based on patient status, additional functional criteria and insurance authorization.  ? ?Follow Up Recommendations ? Acute inpatient rehab (3hours/day)  ?  ?Assistance Recommended at Discharge Frequent or constant Supervision/Assistance  ?Patient can return home with the following A lot of help with walking and/or transfers;A lot of help with  bathing/dressing/bathroom;Direct supervision/assist for medications management;Assist for transportation;Help with stairs or ramp for entrance ? ?  ?Functional Status Assessment ? Patient has had a recent decline in their functional status and demonstrates the ability to make significant improvements in function in a reasonable and predictable amount of time.  ?Equipment Recommendations ? Tub/shower seat  ?  ?Recommendations for Other Services Rehab consult ? ? ?  ?Precautions / Restrictions Precautions ?Precautions: Fall ?Restrictions ?Weight Bearing Restrictions: No  ? ?  ? ?Mobility Bed Mobility ?Overal bed mobility: Needs Assistance ?Bed Mobility: Supine to Sit ?  ?  ?Supine to sit: Min assist ?  ?  ?General bed mobility comments: to manage BLEs ?  ? ?Transfers ?  ?  ?  ?  ?  ?  ?  ?  ?  ?General transfer comment: pt wnated to stay in the bed, he was able to scoot EOB with mod A. verbalizing "I think I could stand." upon attempt, realized he is unsafe to stand, agreeable to practice scooting ?  ? ?  ?Balance Overall balance assessment: Needs assistance ?Sitting-balance support: Feet supported ?Sitting balance-Leahy Scale: Fair ?  ?  ?  ?  ?  ?  ?  ?  ?  ?  ?   ? ?ADL either performed or assessed with clinical judgement  ? ?ADL Overall ADL's : Needs assistance/impaired ?Eating/Feeding: Independent;Sitting ?  ?Grooming: Set up;Sitting ?  ?Upper Body Bathing: Set up;Sitting ?  ?Lower Body Bathing: Minimal assistance;Sitting/lateral leans ?  ?Upper Body Dressing : Set up;Sitting ?  ?Lower Body Dressing: Moderate assistance;Sitting/lateral leans ?Lower Body Dressing Details (indicate cue  type and reason): able to done socks in long sitting ?Toilet Transfer: Moderate assistance ?Toilet Transfer Details (indicate cue type and reason): lateral scooting ?Toileting- Clothing Manipulation and Hygiene: Moderate assistance;Sitting/lateral lean ?  ?  ?  ?Functional mobility during ADLs: Moderate assistance (scooting on  EOB) ?General ADL Comments: very limited insight to deficits and safety  ? ? ? ?Vision Baseline Vision/History: 1 Wears glasses ?Ability to See in Adequate Light: 1 Impaired ?Vision Assessment?: Vision impaired- to be further tested in functional context ?Additional Comments: no acute changes but pt states his vision is poor at baseline  ?   ?   ?   ? ?Pertinent Vitals/Pain Pain Assessment ?Pain Assessment: Faces ?Faces Pain Scale: Hurts little more ?Pain Location: bilat feet ?Pain Descriptors / Indicators: Grimacing ?Pain Intervention(s): Limited activity within patient's tolerance, Monitored during session  ? ? ? ?Hand Dominance   ?  ?Extremity/Trunk Assessment Upper Extremity Assessment ?Upper Extremity Assessment: LUE deficits/detail;RUE deficits/detail ?RUE Deficits / Details: WFL impaired coordination ?RUE Sensation: WNL ?RUE Coordination: decreased fine motor ?LUE Deficits / Details: impiared coordination, grossly weak distal>proximal. poor FM ?LUE Coordination: decreased fine motor ?  ?Lower Extremity Assessment ?Lower Extremity Assessment: Defer to PT evaluation ?RLE Deficits / Details: Uses UEs to move LEs off of the bed; Did not observe any voluntary active movement of hips/knees ?RLE Coordination: decreased gross motor ?LLE Deficits / Details: Uses UEs to move LEs off of the bed; Did not observe any voluntary active movement of hips/knees ?LLE Coordination: decreased gross motor ?  ?Cervical / Trunk Assessment ?Cervical / Trunk Assessment: Normal ?  ?Communication Communication ?Communication: No difficulties ?  ?Cognition Arousal/Alertness: Awake/alert ?Behavior During Therapy: Impulsive ?Overall Cognitive Status: No family/caregiver present to determine baseline cognitive functioning ?Area of Impairment: Memory, Safety/judgement, Problem solving ?  ?  ?  ?  ?  ?  ?  ?  ?  ?  ?Memory: Decreased short-term memory ?  ?Safety/Judgement: Decreased awareness of safety, Decreased awareness of deficits ?   ?Problem Solving: Requires verbal cues, Requires tactile cues ?General Comments: impulsive with movement, poor insight to deficits and safety. needs constant re-direction. ?  ?  ?General Comments  VSS on RA, pt complaining of spasms/cramps in bilat feet ? ?  ? ?Home Living Family/patient expects to be discharged to:: Private residence ?Living Arrangements: Alone ?Available Help at Discharge: Family;Friend(s);Available PRN/intermittently ?Type of Home: Apartment ?Home Access: Level entry ?  ?  ?Home Layout: One level ?  ?  ?Bathroom Shower/Tub: Walk-in shower ?  ?Bathroom Toilet: Handicapped height ?  ?  ?Home Equipment: Wheelchair - manual ?  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  ? ?  ?Prior Functioning/Environment Prior Level of Function : Independent/Modified Independent;Other (comment) ?  ?  ?  ?  ?  ?  ?Mobility Comments: reports is independent with transfers in and out of his wheelchair; did not mention using a sliding board ?ADLs Comments: Uses a leg bag due to MS ?  ? ?  ?  ?OT Problem List: Decreased strength;Decreased range of motion;Decreased activity tolerance;Impaired balance (sitting and/or standing);Decreased coordination;Decreased cognition;Decreased safety awareness;Pain ?  ?   ?OT Treatment/Interventions: Self-care/ADL training;Therapeutic exercise;DME and/or AE instruction;Therapeutic activities;Patient/family education;Balance training  ?  ?OT Goals(Current goals can be found in the care plan section) Acute Rehab OT Goals ?Patient Stated Goal: home ?OT Goal Formulation: With patient ?  Time For Goal Achievement: 06/18/21 ?Potential to Achieve Goals: Good ?ADL Goals ?Pt Will Perform Lower Body Dressing: with modified independence;sitting/lateral leans ?Pt Will Transfer to Toilet: with min guard assist ?Pt Will Perform Toileting - Clothing Manipulation and hygiene:  with supervision;sitting/lateral leans ?Additional ADL Goal #1: Pt will complete IADL medication management task with min A cues  ?OT Frequency: Min 2X/week ?  ? ?   ?AM-PAC OT "6 Clicks" Daily Activity     ?Outcome Measure Help from a

## 2021-06-04 NOTE — Evaluation (Signed)
Physical Therapy Evaluation ?Patient Details ?Name: Derek Sims ?MRN: [id number] ?DOB: December 30, 1955 ?Today's Date: 06/04/2021 ? ?History of Present Illness ? 66 y.o. M  Pt and friend were driving in the car and pt mentioned that his stomach hurt, the friend went into the store to get some Tums and when she returned, he was unresponsive.   When EMS arrived, pt was bradycardic and required external pacing and Epi gtt; was altered and intubated; Sepsis with UTI, Acute hypoxemic respiratory failure secondary to sepsis; extubated 4/21, and transferred to 5W; uses a wheelchair for mobility; PMH: per friend he has MS and seizures, but takes no medications for these and does not seek regular medical care.  ?Clinical Impression ?  ?Pt admitted with above diagnosis. Lives at home alone, in a single-level home with a level entry; Prior to admission, pt was able to transfer to his wheelchair independently via stand pivot transfers, and manage ADLs; assist for transportation, family , friends, and [facility]; Presents to PT with functional dependencies, decr activity tolerance, and decr LE stabilizing muscle activation during functional transfers; Required Max/Mod assist for basic squat pivot transfer bed to recliner;  He is independent at baseline, and considering he hasn't had specific medical care for his MS in quite some time per chart review, I believe an intensive post-acute rehab stay at [facility] is in his best interest with this progressive neurologic condition; Pt currently with functional limitations due to the deficits listed below (see PT Problem List). Pt will benefit from skilled PT to increase their independence and safety with mobility to allow discharge to the venue listed below.      ?   ? ?Recommendations for follow up therapy are one component of a multi-disciplinary discharge planning process, led by the attending physician.  Recommendations may be updated based on patient status, additional functional criteria and  insurance authorization. ? ?Follow Up Recommendations Acute inpatient rehab (3hours/day) ? ?  ?Assistance Recommended at Discharge Intermittent Supervision/Assistance  ?Patient can return home with the following ? A little help with walking and/or transfers;Assist for transportation ? ?  ?Equipment Recommendations Other (comment) (Consider sliding board)  ?Recommendations for Other Services ? OT consult;Rehab consult  ?  ?Functional Status Assessment Patient has had a recent decline in their functional status and demonstrates the ability to make significant improvements in function in a reasonable and predictable amount of time.  ? ?  ?Precautions / Restrictions Precautions ?Precautions: Fall  ? ?  ? ?Mobility ? Bed Mobility ?Overal bed mobility: Needs Assistance ?Bed Mobility: Supine to Sit ?  ?  ?Supine to sit: Min assist ?  ?  ?General bed mobility comments: Min handheld assist to pull to sit; used UEs to help move LEs off teh EOB; cues to fully complete task and scoot forward to get feet to floor ?  ? ?Transfers ?Overall transfer level: Needs assistance ?Equipment used: 1 person hand held assist ?Transfers: Sit to/from Stand, Bed to chair/wheelchair/BSC ?Sit to Stand: Mod assist ?  ?  ?Squat pivot transfers: Max assist, Mod assist ?  ?  ?General transfer comment: Pt initially stating he could stand and pivot to the recliner, however did not note adequate knee strength to help him to stand; pt kept scooting closer to EOB instead of rising to where this PT had to get close and block knees to keep from sliding off bed; Bloscked knees and performed basic squat pivot transfer to the recliner on his R side ?  ? ?Ambulation/Gait ?  ?  ?  ?  ?  ?  ?  ?  ? ?  Stairs ?  ?  ?  ?  ?  ? ?Wheelchair Mobility ?  ? ?Modified Rankin (Stroke Patients Only) ?  ? ?  ? ?Balance Overall balance assessment: Needs assistance ?  ?Sitting balance-Leahy Scale: Fair ?  ?  ?  ?Standing balance-Leahy Scale: Zero ?  ?  ?  ?  ?  ?  ?  ?  ?  ?  ?   ?  ?   ? ? ? ?Pertinent Vitals/Pain Pain Assessment ?Pain Assessment: No/denies pain ?Faces Pain Scale: Hurts a little bit ?Pain Location: Occasional grimace which he attributes to LE spasms, but states no pain when directly asked ?Pain Descriptors / Indicators: Grimacing ?Pain Intervention(s): Monitored during session  ? ? ?Home Living Family/patient expects to be discharged to:: Private residence ?Living Arrangements: Alone ?Available Help at Discharge: Family;Friend(s);Available PRN/intermittently ?Type of Home: Apartment ?Home Access: Level entry ?  ?  ?  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  ?Prior Function Prior Level of Function : Independent/Modified Independent;Other (comment) (Uses [facility] transport, and rides from family and friends) ?  ?  ?  ?  ?  ?  ?Mobility Comments: reports is independent with transfers in and out of his wheelchair; did not mention using a sliding board ?ADLs Comments: Uses a leg bag due to MS ?  ? ? ?Hand Dominance  ?   ? ?  ?Extremity/Trunk Assessment  ? Upper Extremity Assessment ?Upper Extremity Assessment: Defer to OT evaluation ?  ? ?Lower Extremity Assessment ?Lower Extremity Assessment: RLE deficits/detail;LLE deficits/detail ?RLE Deficits / Details: Uses UEs to move LEs off of the bed; Did not observe any voluntary active movement of hips/knees ?RLE Coordination: decreased gross motor ?LLE Deficits / Details: Uses UEs to move LEs off of the bed; Did not observe any voluntary active movement of hips/knees ?LLE Coordination: decreased gross motor ?  ? ?   ?Communication  ? Communication: No difficulties  ?Cognition Arousal/Alertness: Awake/alert ?Behavior During Therapy: [facility] for tasks assessed/performed ?Overall Cognitive Status: No family/caregiver present to  determine baseline cognitive functioning ?Area of Impairment: Memory, Safety/judgement, Problem solving ?  ?  ?  ?  ?  ?  ?  ?  ?  ?  ?Memory: Decreased short-term memory ?  ?Safety/Judgement: Decreased awareness of safety, Decreased awareness of deficits ?  ?Problem Solving: Requires verbal cues, Requires tactile cues ?General Comments: Did not remember discussion re: guitars that we had at the beginning of teh session ?  ?  ? ?  ?General Comments General comments (skin integrity, edema, etc.): VSS on room air; Discussed getting back to bed via a lateral scoot transfer with recliner armrest down; notifed OT and RN of rec for back to bed (not sure that pt will remember) ? ?  ?Exercises    ? ?Assessment/Plan  ?  ?PT Assessment Patient needs continued PT services  ?PT Problem List Decreased strength;Decreased activity tolerance;Decreased balance;Decreased mobility;Decreased coordination;Decreased cognition;Decreased safety awareness;Decreased knowledge of use of DME;Decreased knowledge of precautions;Impaired sensation ? ?   ?  ?PT Treatment Interventions DME instruction;Functional mobility training;Therapeutic activities;Therapeutic exercise;Balance training;Neuromuscular re-education;Cognitive remediation;Patient/family education;Wheelchair mobility training   ? ?PT Goals (Current goals can be found in the Care Plan section)  ?Acute Rehab PT Goals ?Patient Stated Goal: Back to indpeendence ?PT Goal Formulation: With  patient ?Time For Goal Achievement: 06/18/21 ?Potential to Achieve Goals: Good ?Additional Goals ?Additional Goal #1: Pt will be able to verbalize and demonstrate options for pressure relief in sitting and laying down ? ?  ?Frequency Min 3X/week ?  ? ? ?Co-evaluation   ?  ?  ?  ?  ? ? ?  ?AM-PAC PT "6 Clicks" Mobility  ?Outcome Measure Help needed turning from your back to your side while in a flat bed without using bedrails?: None ?Help needed moving from lying on your back to sitting on the side of a  flat bed without using bedrails?: A Little ?Help needed moving to and from a bed to a chair (including a wheelchair)?: A Lot ?Help needed standing up from a chair using your arms (e.g., wheelchair or bedsid

## 2021-06-04 NOTE — Evaluation (Signed)
Clinical/Bedside Swallow Evaluation ?Patient Details  ?Name: Derek Sims ?MRN: [id number] ?Date of Birth: 1955/08/25 ? ?Today's Date: 06/04/2021 ?Time: SLP Start Time (ACUTE ONLY): 0930 SLP Stop Time (ACUTE ONLY): 0941 ?SLP Time Calculation (min) (ACUTE ONLY): 11 min ? ?Past Medical History:  ?Past Medical History:  ?Diagnosis Date  ? MS (multiple sclerosis) (HCC)   ? Seizure [facility])   ? ? ?HPI:  ?66 year old male with history of seizure disorder and multiple sclerosis was brought into the emergency department after he was found unresponsive in his car after he complained of abdominal pain.  He was found to be bradycardic and hypotensive; intubated in ED, extubated same day.  Dx  acute hypoxic respiratory failure, acute encephalopathy, sepsis due to acute UTI,  ?  ?Assessment / Plan / Recommendation  ?Clinical Impression ? Pt was seen for bedside swallowing evaluation. He reports no hx of dysphagia.  Oral mechanism exam normal; no focal CN deficits; voice/cough strong.  Pt demonstrated normal swallow function with thorough mastication, brisk swallow response, no s/s of aspiration with solids, purees, thin liquids.  No dysphagia. Recommend continuing regular solids/thin liquids; meds whole with liquid. No SLP f/u. ?SLP Visit Diagnosis: Dysphagia, unspecified (R13.10) ?   ?Aspiration Risk ? No limitations  ?  ?Diet Recommendation    ? ?Medication Administration: Whole meds with liquid  ?  ?Other  Recommendations Oral Care Recommendations: Oral care BID   ? ?Recommendations for follow up therapy are one component of a multi-disciplinary discharge planning process, led by the attending physician.  Recommendations may be updated based on patient status, additional functional criteria and insurance authorization. ? ?Follow up Recommendations No SLP follow up  ? ? ?  ? ? ?Swallow Study   ?General Date of Onset: 06/02/21 ?HPI: 66 year old male with history of seizure disorder and multiple sclerosis was brought into the  emergency department after he was found unresponsive in his car after he complained of abdominal pain.  He was found to be bradycardic and hypotensive; intubated in ED, extubated same day.  Dx  acute hypoxic respiratory failure, acute encephalopathy, sepsis due to acute UTI, ?Type of Study: Bedside Swallow Evaluation ?Previous Swallow Assessment: no ?Diet Prior to this Study: Regular;Thin liquids ?Temperature Spikes Noted: No ?Respiratory Status: Room air ?History of Recent Intubation: Yes ?Length of Intubations (days): 1 days ?Date extubated: 07/02/21 ?Behavior/Cognition: Alert;Cooperative;Pleasant mood ?Oral Cavity Assessment: Within Functional Limits ?Oral Care Completed by SLP: No ?Oral Cavity - Dentition: Poor condition ?Vision: Functional for self-feeding ?Self-Feeding Abilities: Able to feed self ?Patient Positioning: Upright in bed ?Volitional Cough: Strong ?Volitional Swallow: Able to elicit  ?  ?Oral/Motor/Sensory Function Overall Oral Motor/Sensory Function: Within functional limits   ?Ice Chips Ice chips: Within functional limits   ?Thin Liquid Thin Liquid: Within functional limits  ?  ?Nectar Thick Nectar Thick Liquid: Not tested   ?Honey Thick Honey Thick Liquid: Not tested   ?Puree Puree: Within functional limits   ?Solid ? ? ? [name] L. Briannie Gutierrez, MA CCC/SLP ?Acute Rehabilitation Services ?Office number [phone] ?Pager [phone] ? Solid: Within functional limits  ? ?  ? ?[name] ?06/04/2021,9:45 AM ? ? ? ?

## 2021-06-04 NOTE — Progress Notes (Signed)
Patient's sister, Amil Amen called and said to call her once the patient is discharged, please leave a voicemail if she doesn't answer. Amil Amen has pt's wheelchair.  ?

## 2021-06-04 NOTE — Progress Notes (Signed)
[facility] night cross cover note: ? ?I was notified by RN of patient's request for pain medication for right foot discomfort as well as request for sleep aid. Prn melatonin ordered.  ? ?Initially, for his right foot pain, I ordered prn IV fentanyl.  However, the patient subsequently conveyed his preference for prn Tylenol. I then d/c'ed prn fentanyl and ordered prn acetaminophen. ? ? ? ? ?Babs Bertin, DO ?Hospitalist ? ?

## 2021-06-04 NOTE — Progress Notes (Signed)
?                                  PROGRESS NOTE                                             ?                                                                                                                     ?                                         ? ? Patient Demographics:  ? ? Derek Sims, is a 66 y.o. male, DOB - 1955/06/28, CHY:[id number] ? ?Outpatient Primary MD for the patient is Pcp, No    LOS - 2  Admit date - 06/02/2021   ? ?Chief Complaint  ?Patient presents with  ? Unresponsive  ?    ? ?Brief Narrative (HPI from H&P)  - 77 y.o. M with unclear PMH as no hx in Epic, per friend he has MS and seizures, but takes no medications for these and does not seek regular medical care.  On the day of admission he was with a friend in a car where he developed some abdominal pain and subsequently became unresponsive.  He was brought to the ER where he was diagnosed with UTI, AMS and admitted to the hospital under the care of PCCM, he was briefly intubated.  Subsequently extubated stabilized and transferred to my care on 06/05/2018 on day 2 of his hospital stay. ? ? Subjective:  ? ? [name] today has, No headache, No chest pain, No abdominal pain - No Nausea, No new weakness tingling or numbness, no SOB. ? ? Assessment  & Plan :  ? ? ?Sepsis, suspect secondary to UTI -chronically has an external catheter.  Klebsiella, on Rocephin, sepsis pathophysiology has resolved, will monitor bladder scans every shift to rule out any urinary retention or incomplete bladder emptying.  Outpatient urology follow-up for nonspecific CT scan findings ? ?Acute Hypoxic Respiratory Failure secondary to Sepsis  - briefly intubated for 24 hours, this problem has resolved.  This was due to sepsis. ? ?Acute Metabolic Encephalopathy  - CT head nonacute EEG nonspecific, AMS could have been due to sepsis, stable and at baseline.  No acute issues.  UDS positive for benzodiazepines and narcotics but  patient denies using it. ?  ?AKI -  likely due to sepsis, improved to 1.1 with IVF, resolved. ?  ?Decubitus Ulcer POA - wound care per nursing ?  ?MS Wheelchair bound at baseline, supportive care . ? ? ?   ? ?Condition - Fair ? ?Family Communication  :  None  Present ? ?Code Status :  Full ? ?Consults  :  PCCM ? ?PUD Prophylaxis :  ? ? Procedures  :    ? ?CT scan head.  Nonacute.   ? ?EEG.  Nonspecific.   ? ?CT scan abdomen pelvis. 1. 3 mm nonobstructive calculus in the lower pole collecting system of left kidney. No ureteral stones or findings of urinary tract obstruction are noted at this time. 2. Cholelithiasis without evidence of acute cholecystitis at this time. 3. Indeterminate lesion in the lower pole of the right kidney, with imaging characteristics that could be concerning for potential cystic neoplasm. Further evaluation with nonemergent abdominal MRI with and without IV gadolinium is recommended in the near future to better evaluate this finding. Alternatively, hematuria protocol CT scan could be considered, which would also allow evaluation of the bladder abnormality (discussed below). 4. Additionally, the patient has a right-sided bladder diverticulum, with some dependent soft tissue or debris within the lumen of the diverticulum. Outpatient referral to Urology for further clinical evaluation is recommended. 5. Aortic atherosclerosis. ? ?   ? ?Disposition Plan  :   ? ?Status is: Inpatient ? ?DVT Prophylaxis  :   ? ?heparin injection 5,000 Units Start: 06/02/21 1400 ?SCDs Start: 06/02/21 0406 ? ?Lab Results  ?Component Value Date  ? PLT 170 06/04/2021  ? ? ?Diet :  ?Diet Order   ? ?       ?  Diet regular Room service appropriate? Yes; Fluid consistency: Thin  Diet effective now       ?  ? ?  ?  ? ?  ?  ? ?Inpatient Medications ? ?Scheduled Meds: ? heparin  5,000 Units Subcutaneous Q8H  ? mouth rinse  15 mL Mouth Rinse BID  ? ?Continuous Infusions: ? cefTRIAXone (ROCEPHIN)  IV 1 g (06/04/21 1023)  ? ?PRN  Meds:.acetaminophen, docusate, melatonin, polyethylene glycol ? ?Antibiotics  :   ? ?Anti-infectives (From admission, onward)  ? ? Start     Dose/Rate Route Frequency Ordered Stop  ? 06/03/21 1000  cefTRIAXone (ROCEPHIN) 1 g in sodium chloride 0.9 % 100 mL IVPB       ? 1 g ?200 mL/hr over 30 Minutes Intravenous Every 24 hours 06/03/21 0749    ? 06/02/21 1000  ceFEPIme (MAXIPIME) 2 g in sodium chloride 0.9 % 100 mL IVPB  Status:  Discontinued       ? 2 g ?200 mL/hr over 30 Minutes Intravenous Every 12 hours 06/02/21 0617 06/03/21 0749  ? 06/02/21 0330  ceFEPIme (MAXIPIME) 2 g in sodium chloride 0.9 % 100 mL IVPB       ? 2 g ?200 mL/hr over 30 Minutes Intravenous  Once 06/02/21 0316 06/02/21 0406  ? ?  ? ? ? Time Spent in minutes  30 ? ? ?Susa Raring M.D on 06/04/2021 at 10:45 AM ? ?To page go to [email or web address]  ? ?[facility] -  Office  [phone] ? ?See all Orders from today for further details ? ? ? Objective:  ? ?Vitals:  ? 06/04/21 0300 06/04/21 0400 06/04/21 0500 06/04/21 0736  ?BP: (!) 157/98 (!) 151/80  (!) 143/77  ?Pulse: 84 82  66  ?Resp: 17 16  15   ?Temp: 98 ?F (36.7 ?C)   97.7 ?F (36.5 ?C)  ?TempSrc: Oral   Oral  ?SpO2: 96%     ?Weight:   76.9 kg   ?Height:      ? ? ?Wt Readings from Last 3 Encounters:  ?06/04/21  76.9 kg  ? ? ? ?Intake/Output Summary (Last 24 hours) at 06/04/2021 1045 ?Last data filed at 06/04/2021 0746 ?Gross per 24 hour  ?Intake 27.35 ml  ?Output 2250 ml  ?Net -2222.65 ml  ? ? ? ?Physical Exam ? ?Awake Alert, No new F.N deficits, Normal affect ?[place].AT,PERRAL ?Supple Neck, No JVD,   ?Symmetrical Chest wall movement, Good air movement bilaterally, CTAB ?RRR,No Gallops,Rubs or new Murmurs,  ?+ve B.Sounds, Abd Soft, No tenderness,   ?No Cyanosis, Clubbing or edema  ?  ? ?RN pressure injury documentation: ?Pressure Injury 06/02/21 Coccyx Medial Stage 1 -  Intact skin with non-blanchable redness of a localized area usually over a bony prominence. stage 1 sacrum (Active)  ?06/02/21 0521   ?Location: Coccyx  ?Location Orientation: Medial  ?Staging: Stage 1 -  Intact skin with non-blanchable redness of a localized area usually over a bony prominence.  ?Wound Description (Comments): stage 1 sacrum  ?Present on Admission: Yes  ?Dressing Type Foam - Lift dressing to assess site every shift 06/04/21 0746  ? ? ? Data Review:  ? ? ?CBC ?Recent Labs  ?Lab 06/02/21 ?0114 06/02/21 ?0204 06/02/21 ?6789 06/02/21 ?3810 06/03/21 ?0142 06/04/21 ?0146  ?WBC 25.6*  --   --  11.7* 8.5 8.7  ?HGB 15.4 15.0 12.6* 13.4 13.7 13.5  ?HCT 47.8 44.0 37.0* 41.6 40.6 41.4  ?PLT 315  --   --  171 155 170  ?MCV 93.4  --   --  91.8 90.0 89.4  ?MCH 30.1  --   --  29.6 30.4 29.2  ?MCHC 32.2  --   --  32.2 33.7 32.6  ?RDW 12.9  --   --  13.0 12.9 12.6  ?LYMPHSABS 2.6  --   --   --   --   --   ?MONOABS 1.5*  --   --   --   --   --   ?EOSABS 0.5  --   --   --   --   --   ?BASOSABS 0.0  --   --   --   --   --   ? ? ?Electrolytes ?Recent Labs  ?Lab 06/02/21 ?0114 06/02/21 ?0204 06/02/21 ?1751 06/02/21 ?0258 06/02/21 ?5277 06/03/21 ?0142 06/04/21 ?0146  ?NA 142 142  --  141 139 140 140  ?K 3.6 3.6  --  3.9 4.0 3.5 3.4*  ?CL 108  --   --   --  109 109 108  ?CO2 21*  --   --   --  23 24 23   ?GLUCOSE 137*  --   --   --  127* 106* 95  ?BUN 24*  --   --   --  21 13 10   ?CREATININE 1.41*  --   --   --  1.11 0.98 0.96  ?CALCIUM 8.4*  --   --   --  8.3* 8.6* 8.7*  ?AST 22  --   --   --   --  20  --   ?ALT 21  --   --   --   --  18  --   ?ALKPHOS 82  --   --   --   --  73  --   ?BILITOT 0.7  --   --   --   --  0.9  --   ?ALBUMIN 3.6  --   --   --   --  3.1*  --   ?MG  --   --   --   --  1.8  --  1.8  ?LATICACIDVEN 4.8*  --  3.0*  --   --   --   --   ?AMMONIA 26  --   --   --   --   --   --   ? ?  ? ?Micro Results ?Recent Results (from the past 240 hour(s))  ?Culture, blood (single)     Status: None (Preliminary result)  ? Collection Time: 06/02/21  1:15 AM  ? Specimen: BLOOD RIGHT HAND  ?Result Value Ref Range Status  ? Specimen Description BLOOD  RIGHT HAND  Final  ? Special Requests   Final  ?  BOTTLES DRAWN AEROBIC AND ANAEROBIC Blood Culture results may not be optimal due to an inadequate volume of blood received in culture bottles  ? Culture

## 2021-06-04 NOTE — Progress Notes (Signed)
? ?  Inpatient Rehab Admissions Coordinator : ? ?Per therapy recommendations, patient was screened for CIR candidacy by Pavneet Markwood RN MSN.  At this time patient appears to be a potential candidate for CIR. I will place a rehab consult per protocol for full assessment. Please call me with any questions. ? ?Billiejo Sorto RN MSN ?Admissions Coordinator ?[phone] ?  ?

## 2021-06-05 ENCOUNTER — Telehealth: Payer: Self-pay

## 2021-06-05 DIAGNOSIS — J9600 Acute respiratory failure, unspecified whether with hypoxia or hypercapnia: Secondary | ICD-10-CM | POA: Diagnosis not present

## 2021-06-05 DIAGNOSIS — G9340 Encephalopathy, unspecified: Secondary | ICD-10-CM

## 2021-06-05 DIAGNOSIS — G35 Multiple sclerosis: Secondary | ICD-10-CM | POA: Diagnosis not present

## 2021-06-05 DIAGNOSIS — R6520 Severe sepsis without septic shock: Secondary | ICD-10-CM | POA: Diagnosis not present

## 2021-06-05 DIAGNOSIS — A419 Sepsis, unspecified organism: Secondary | ICD-10-CM | POA: Diagnosis not present

## 2021-06-05 LAB — CBC WITH DIFFERENTIAL/PLATELET
Abs Immature Granulocytes: 0.02 10*3/uL (ref 0.00–0.07)
Basophils Absolute: 0 10*3/uL (ref 0.0–0.1)
Basophils Relative: 1 %
Eosinophils Absolute: 0.4 10*3/uL (ref 0.0–0.5)
Eosinophils Relative: 5 %
HCT: 39.8 % (ref 39.0–52.0)
Hemoglobin: 13.6 g/dL (ref 13.0–17.0)
Immature Granulocytes: 0 %
Lymphocytes Relative: 30 %
Lymphs Abs: 2.1 10*3/uL (ref 0.7–4.0)
MCH: 30.5 pg (ref 26.0–34.0)
MCHC: 34.2 g/dL (ref 30.0–36.0)
MCV: 89.2 fL (ref 80.0–100.0)
Monocytes Absolute: 0.7 10*3/uL (ref 0.1–1.0)
Monocytes Relative: 10 %
Neutro Abs: 3.8 10*3/uL (ref 1.7–7.7)
Neutrophils Relative %: 54 %
Platelets: 160 10*3/uL (ref 150–400)
RBC: 4.46 MIL/uL (ref 4.22–5.81)
RDW: 12.5 % (ref 11.5–15.5)
WBC: 7 10*3/uL (ref 4.0–10.5)
nRBC: 0 % (ref 0.0–0.2)

## 2021-06-05 LAB — COMPREHENSIVE METABOLIC PANEL
ALT: 17 U/L (ref 0–44)
AST: 19 U/L (ref 15–41)
Albumin: 3.3 g/dL — ABNORMAL LOW (ref 3.5–5.0)
Alkaline Phosphatase: 67 U/L (ref 38–126)
Anion gap: 6 (ref 5–15)
BUN: 14 mg/dL (ref 8–23)
CO2: 23 mmol/L (ref 22–32)
Calcium: 8.8 mg/dL — ABNORMAL LOW (ref 8.9–10.3)
Chloride: 109 mmol/L (ref 98–111)
Creatinine, Ser: 0.91 mg/dL (ref 0.61–1.24)
GFR, Estimated: >60 mL/min (ref >60)
Glucose, Bld: 87 mg/dL (ref 70–99)
Potassium: 3.6 mmol/L (ref 3.5–5.1)
Sodium: 138 mmol/L (ref 135–145)
Total Bilirubin: 0.7 mg/dL (ref 0.3–1.2)
Total Protein: 6.1 g/dL — ABNORMAL LOW (ref 6.5–8.1)

## 2021-06-05 LAB — MAGNESIUM: Magnesium: 2.1 mg/dL (ref 1.7–2.4)

## 2021-06-05 LAB — BRAIN NATRIURETIC PEPTIDE: B Natriuretic Peptide: 32 pg/mL (ref 0.0–100.0)

## 2021-06-05 MED ORDER — LORAZEPAM 0.5 MG PO TABS
0.5000 mg | ORAL_TABLET | Freq: Once | ORAL | Status: AC
  Administered 2021-06-05: 0.5 mg via ORAL
  Filled 2021-06-05: qty 1

## 2021-06-05 MED ORDER — CEFAZOLIN SODIUM-DEXTROSE 1-4 GM/50ML-% IV SOLN
1.0000 g | Freq: Three times a day (TID) | INTRAVENOUS | Status: DC
  Administered 2021-06-06: 1 g via INTRAVENOUS
  Filled 2021-06-05 (×3): qty 50

## 2021-06-05 MED ORDER — SODIUM CHLORIDE 0.9 % IV SOLN: INTRAVENOUS | Status: DC | PRN

## 2021-06-05 NOTE — Progress Notes (Signed)
Physical Therapy Treatment ?Patient Details ?Name: Derek Sims Mccanless ?MRN: [id number] ?DOB: 11-13-1955 ?Today's Date: 06/05/2021 ? ? ?History of Present Illness 66 y.o. M  Pt and friend were driving in the car and pt mentioned that his stomach hurt, the friend went into the store to get some Tums and when she returned, he was unresponsive.   When EMS arrived, pt was bradycardic and required external pacing and Epi gtt; was altered and intubated; Sepsis with UTI, Acute hypoxemic respiratory failure secondary to sepsis; extubated 4/21, and transferred to 5W; uses a wheelchair for mobility; PMH: per friend he has MS and seizures, but takes no medications for these and does not seek regular medical care. ? ?  ?PT Comments  ? ? Pt was seen for progression from bed to chair, definitely impulsive at times but unclear if this is baseline.  Pt has been home alone but his history of hosp admission indicates he does have friends who help him.  Pt is recommended to go to rehab for a short stay, updated to SNF but has declared he wishes to go home.  Will expect him to decline the admission, and will then default to HHPT for safety and strengthening.  Follow up acutely with goals of POC, focusing on use of AD's and safety education.     ?Recommendations for follow up therapy are one component of a multi-disciplinary discharge planning process, led by the attending physician.  Recommendations may be updated based on patient status, additional functional criteria and insurance authorization. ? ?Follow Up Recommendations ? Skilled nursing-short term rehab (<3 hours/day) ?  ?  ?Assistance Recommended at Discharge Intermittent Supervision/Assistance  ?Patient can return home with the following A little help with walking and/or transfers;A little help with bathing/dressing/bathroom;Assistance with cooking/housework;Assist for transportation ?  ?Equipment Recommendations ? Other (comment) (sliding board, will not use and will refuse it)   ?  ?Recommendations for Other Services OT consult ? ? ?  ?Precautions / Restrictions Precautions ?Precautions: Fall ?Precaution Comments: incontinent of urine ?Restrictions ?Weight Bearing Restrictions: No  ?  ? ?Mobility ? Bed Mobility ?  ?  ?  ?  ?  ?  ?  ?General bed mobility comments: sitting on side of bed when PT entered ?  ? ?Transfers ?Overall transfer level: Needs assistance ?Equipment used: 1 person hand held assist ?Transfers: Bed to chair/wheelchair/BSC ?  ?  ?  ?  ?  ? Lateral/Scoot Transfers: Min guard ?General transfer comment: pt scooted to bedside and into chair with PT providing min guard for safety.  Has impulsive tendencies, but could demonstrate the actual transfer. ?  ? ?Ambulation/Gait ?  ?  ?  ?  ?  ?  ?  ?General Gait Details: unable to stand ? ? ?Stairs ?  ?  ?  ?  ?  ? ? ?Wheelchair Mobility ?Wheelchair Mobility ?Wheelchair mobility: Yes ?Wheelchair propulsion: Both upper extremities, Right lower extremity ?Wheelchair parts: Supervision/cueing ?Distance: 3 ?Wheelchair Assistance Details (indicate cue type and reason): he can propel chair but has lines and is in [place] chair, too heavy for his strength ? ?Modified Rankin (Stroke Patients Only) ?  ? ? ?  ?Balance Overall balance assessment: Needs assistance ?Sitting-balance support: Feet supported ?Sitting balance-Leahy Scale: Fair ?  ?  ?  ?  ?  ?  ?  ?  ?  ?  ?  ?  ?  ?  ?  ?  ?  ? ?  ?Cognition Arousal/Alertness: Awake/alert ?Behavior During Therapy:  Impulsive ?Overall Cognitive Status: No family/caregiver present to determine baseline cognitive functioning ?Area of Impairment: Problem solving, Awareness, Safety/judgement, Following commands, Memory, Attention, Orientation ?  ?  ?  ?  ?  ?  ?  ?  ?Orientation Level: Situation ?Current Attention Level: Selective ?Memory: Decreased short-term memory ?Following Commands: Follows one step commands with increased time ?Safety/Judgement: Decreased awareness of safety ?Awareness:  Intellectual ?Problem Solving: Requires verbal cues, Requires tactile cues ?General Comments: pt was able to perform mobility but is trying to get to chair over the bed rail ?  ?  ? ?  ?Exercises   ? ?  ?General Comments General comments (skin integrity, edema, etc.): pt is now focused enough to slide laterally to a chair, aware of the sequence due to his history of being in a chair. ?  ?  ? ?Pertinent Vitals/Pain Pain Assessment ?Pain Assessment: No/denies pain  ? ? ?Home Living   ?  ?  ?  ?  ?  ?  ?  ?  ?  ?   ?  ?Prior Function    ?  ?  ?   ? ?PT Goals (current goals can now be found in the care plan section) Acute Rehab PT Goals ?Patient Stated Goal: independence ?Progress towards PT goals: Progressing toward goals ? ?  ?Frequency ? ? ? Min 3X/week ? ? ? ?  ?PT Plan Discharge plan needs to be updated  ? ? ?Co-evaluation   ?  ?  ?  ?  ? ?  ?AM-PAC PT "6 Clicks" Mobility   ?Outcome Measure ? Help needed turning from your back to your side while in a flat bed without using bedrails?: None ?Help needed moving from lying on your back to sitting on the side of a flat bed without using bedrails?: None ?Help needed moving to and from a bed to a chair (including a wheelchair)?: A Little ?Help needed standing up from a chair using your arms (e.g., wheelchair or bedside chair)?: Total ?Help needed to walk in hospital room?: Total ?Help needed climbing 3-5 steps with a railing? : Total ?6 Click Score: 14 ? ?  ?End of Session Equipment Utilized During Treatment: Other (comment) ?Activity Tolerance: Patient tolerated treatment well;Patient limited by fatigue ?Patient left: in chair;with call bell/phone within reach ?Nurse Communication: Mobility status;Other (comment) (in wheelchair, cath is off, nursing to supervsie to bed) ?PT Visit Diagnosis: Other abnormalities of gait and mobility (R26.89);Muscle weakness (generalized) (M62.81) ?  ? ? ?Time: 1610-9604 ?PT Time Calculation (min) (ACUTE ONLY): 33 min ? ?Charges:   $Therapeutic Activity: 23-37 mins    ?Ivar Drape ?06/05/2021, 11:29 AM ? ?Samul Dada, PT PhD ?Acute Rehab Dept. Number: [facility] [phone] and [facility] [phone] ? ? ?

## 2021-06-05 NOTE — Progress Notes (Addendum)
?                                  PROGRESS NOTE                                             ?                                                                                                                     ?                                         ? ? Patient Demographics:  ? ? Derek Sims, is a 66 y.o. male, DOB - January 21, 1956, ZOX:[id number] ? ?Outpatient Primary MD for the patient is Pcp, No    LOS - 3  Admit date - 06/02/2021   ? ?Chief Complaint  ?Patient presents with  ? Unresponsive  ?    ? ?Brief Narrative (HPI from H&P)  - 26 y.o. M with unclear PMH as no hx in Epic, per friend he has MS and seizures, but takes no medications for these and does not seek regular medical care.  On the day of admission he was with a friend in a car where he developed some abdominal pain and subsequently became unresponsive.  He was brought to the ER where he was diagnosed with UTI, AMS and admitted to the hospital under the care of PCCM, he was briefly intubated.  Subsequently extubated stabilized and transferred to my care on 06/05/2018 on day 2 of his hospital stay. ? ? Subjective:  ? ?Patient in bed, appears comfortable, denies any headache, no fever, no chest pain or pressure, no shortness of breath , no abdominal pain. No new focal weakness. ? ? ? Assessment  & Plan :  ? ? ?Sepsis, suspect secondary to UTI -chronically has an external catheter.  Klebsiella, on Rocephin, sepsis pathophysiology has resolved, will monitor bladder scans every shift to rule out any urinary retention or incomplete bladder emptying.  Outpatient urology follow-up for nonspecific CT scan findings ? ?Acute Hypoxic Respiratory Failure secondary to Sepsis  - briefly intubated for 24 hours, this problem has resolved.  This was due to sepsis. ? ?Acute Metabolic Encephalopathy  - CT head nonacute EEG nonspecific, AMS could have been due to sepsis, stable and at baseline.  No acute issues.  UDS positive for  benzodiazepines and narcotics but patient denies using it.  Question if he got some in the ER for intubation or foul play - pharm checking the timeline, also discussed with his sister. ? ?Also remote H/O seizures, he had belly pain and BM and then passed out, ? Partial complex with prodrome of belly pain  in the setting of UTI, spot EEG was negative, will DW Neuro. ? ?  ?AKI -  likely due to sepsis, improved to 1.1 with IVF, resolved. ?  ?Decubitus Ulcer POA - wound care per nursing ?  ?MS Wheelchair bound at baseline, supportive care . PT OT here, refuses placement. ? ? ?   ? ?Condition - Fair ? ?Family Communication  :  sister Amil Amen [phone] on 06/05/21 ? ?Code Status :  Full ? ?Consults  :  PCCM ? ?PUD Prophylaxis :  ? ? Procedures  :    ? ?CT scan head.  Nonacute.   ? ?EEG.  Nonspecific.   ? ?CT scan abdomen pelvis. 1. 3 mm nonobstructive calculus in the lower pole collecting system of left kidney. No ureteral stones or findings of urinary tract obstruction are noted at this time. 2. Cholelithiasis without evidence of acute cholecystitis at this time. 3. Indeterminate lesion in the lower pole of the right kidney, with imaging characteristics that could be concerning for potential cystic neoplasm. Further evaluation with nonemergent abdominal MRI with and without IV gadolinium is recommended in the near future to better evaluate this finding. Alternatively, hematuria protocol CT scan could be considered, which would also allow evaluation of the bladder abnormality (discussed below). 4. Additionally, the patient has a right-sided bladder diverticulum, with some dependent soft tissue or debris within the lumen of the diverticulum. Outpatient referral to Urology for further clinical evaluation is recommended. 5. Aortic atherosclerosis. ? ?   ? ?Disposition Plan  :   ? ?Status is: Inpatient ? ?DVT Prophylaxis  :   ? ?heparin injection 5,000 Units Start: 06/02/21 1400 ?SCDs Start: 06/02/21 0406 ? ?Lab Results   ?Component Value Date  ? PLT 160 06/05/2021  ? ? ?Diet :  ?Diet Order   ? ?       ?  Diet regular Room service appropriate? Yes; Fluid consistency: Thin  Diet effective now       ?  ? ?  ?  ? ?  ?  ? ?Inpatient Medications ? ?Scheduled Meds: ? heparin  5,000 Units Subcutaneous Q8H  ? mouth rinse  15 mL Mouth Rinse BID  ? nicotine  21 mg Transdermal Daily  ? ?Continuous Infusions: ? sodium chloride 10 mL/hr at 06/05/21 1036  ? cefTRIAXone (ROCEPHIN)  IV 1 g (06/05/21 1037)  ? ?PRN Meds:.sodium chloride, acetaminophen, docusate, melatonin, polyethylene glycol ? ?Antibiotics  :   ? ?Anti-infectives (From admission, onward)  ? ? Start     Dose/Rate Route Frequency Ordered Stop  ? 06/03/21 1000  cefTRIAXone (ROCEPHIN) 1 g in sodium chloride 0.9 % 100 mL IVPB       ? 1 g ?200 mL/hr over 30 Minutes Intravenous Every 24 hours 06/03/21 0749    ? 06/02/21 1000  ceFEPIme (MAXIPIME) 2 g in sodium chloride 0.9 % 100 mL IVPB  Status:  Discontinued       ? 2 g ?200 mL/hr over 30 Minutes Intravenous Every 12 hours 06/02/21 0617 06/03/21 0749  ? 06/02/21 0330  ceFEPIme (MAXIPIME) 2 g in sodium chloride 0.9 % 100 mL IVPB       ? 2 g ?200 mL/hr over 30 Minutes Intravenous  Once 06/02/21 0316 06/02/21 0406  ? ?  ? ? ? Time Spent in minutes  30 ? ? ?Susa Raring M.D on 06/05/2021 at 11:33 AM ? ?To page go to [email or web address]  ? ?[facility] -  Office  [phone] ? ?See all Orders  from today for further details ? ? ? Objective:  ? ?Vitals:  ? 06/05/21 0400 06/05/21 0500 06/05/21 0600 06/05/21 0800  ?BP: 132/77   (!) 147/90  ?Pulse: 64     ?Resp: ?Temp:    98 ?F (36.7 ?C)  ?TempSrc:    Oral  ?SpO2:      ?Weight:  75.3 kg    ?Height:      ? ? ?Wt Readings from Last 3 Encounters:  ?06/05/21 75.3 kg  ? ? ? ?Intake/Output Summary (Last 24 hours) at 06/05/2021 1133 ?Last data filed at 06/05/2021 0502 ?Gross per 24 hour  ?Intake 102.44 ml  ?Output 1000 ml  ?Net -897.56 ml  ? ? ? ?Physical Exam ? ?Awake Alert, No new F.N  deficits, Normal affect ?[place].AT,PERRAL ?Supple Neck, No JVD,   ?Symmetrical Chest wall movement, Good air movement bilaterally, CTAB ?RRR,No Gallops, Rubs or new Murmurs,  ?+ve B.Sounds, Abd Soft, No tenderness,   ?No Cyanosis, Clubbing or edema  ? ?  ? ?RN pressure injury documentation: ?Pressure Injury 06/02/21 Coccyx Medial Stage 1 -  Intact skin with non-blanchable redness of a localized area usually over a bony prominence. stage 1 sacrum (Active)  ?06/02/21 0521  ?Location: Coccyx  ?Location Orientation: Medial  ?Staging: Stage 1 -  Intact skin with non-blanchable redness of a localized area usually over a bony prominence.  ?Wound Description (Comments): stage 1 sacrum  ?Present on Admission: Yes  ?Dressing Type Foam - Lift dressing to assess site every shift 06/04/21 2000  ? ? ? Data Review:  ? ? ?CBC ?Recent Labs  ?Lab 06/02/21 ?0114 06/02/21 ?0204 06/02/21 ?6962 06/02/21 ?9528 06/03/21 ?0142 06/04/21 ?4132 06/05/21 ?0112  ?WBC 25.6*  --   --  11.7* 8.5 8.7 7.0  ?HGB 15.4   < > 12.6* 13.4 13.7 13.5 13.6  ?HCT 47.8   < > 37.0* 41.6 40.6 41.4 39.8  ?PLT 315  --   --  171 155 170 160  ?MCV 93.4  --   --  91.8 90.0 89.4 89.2  ?MCH 30.1  --   --  29.6 30.4 29.2 30.5  ?MCHC 32.2  --   --  32.2 33.7 32.6 34.2  ?RDW 12.9  --   --  13.0 12.9 12.6 12.5  ?LYMPHSABS 2.6  --   --   --   --   --  2.1  ?MONOABS 1.5*  --   --   --   --   --  0.7  ?EOSABS 0.5  --   --   --   --   --  0.4  ?BASOSABS 0.0  --   --   --   --   --  0.0  ? < > = values in this interval not displayed.  ? ? ?Electrolytes ?Recent Labs  ?Lab 06/02/21 ?0114 06/02/21 ?0204 06/02/21 ?4401 06/02/21 ?0272 06/02/21 ?5366 06/03/21 ?0142 06/04/21 ?4403 06/05/21 ?0112  ?NA 142   < >  --  141 139 140 140 138  ?K 3.6   < >  --  3.9 4.0 3.5 3.4* 3.6  ?CL 108  --   --   --  109 109 108 109  ?CO2 21*  --   --   --  ?GLUCOSE 137*  --   --   --  127* 106* 95 87  ?BUN 24*  --   --   --  21 13 10  14  ?CREATININE 1.41*  --   --   --  1.11 0.98 0.96 0.91   ?CALCIUM 8.4*  --   --   --  8.3* 8.6* 8.7* 8.8*  ?AST 22  --   --   --   --  20  --  19  ?ALT 21  --   --   --   --  18  --  17  ?ALKPHOS 82  --   --   --   --  73  --  67  ?BILITOT 0.7  --   --   --   --  0.9  --  0.7  ?A

## 2021-06-05 NOTE — Consult Note (Addendum)
Please see my full consult note on this patient. ?

## 2021-06-05 NOTE — TOC Initial Note (Signed)
Transition of Care (TOC) - Initial/Assessment Note  ? ? ?Patient Details  ?Name: Derek Sims ?MRN: [id number] ?Date of Birth: 09/15/1955 ? ?Transition of Care (TOC) CM/SW Contact:    ?Cyndi Bender, RN ?Phone Number: ?06/05/2021, 3:41 PM ? ?Clinical Narrative:                ?Spoke to patient regarding transition needs. Patient wants to go home and is agreeable to home health. Patient uses [place] access to get to appointments and other places. Patient deferred to [facility] to find highly rated home health agency. Malachy Mood with [facility] accepted referral.  ?Spoke to sister, Gregary Signs and notified her of the plan. New PCP  and urology appointment made, on AVS. Family can transport home. Patient has all needed DME.  ?TOC will continue to follow ? ?Expected Discharge Plan: [place] ?Barriers to Discharge: Continued Medical Work up ? ? ?Patient Goals and CMS Choice ?Patient states their goals for this hospitalization and ongoing recovery are:: return home ?CMS Medicare.gov Compare Post Acute Care list provided to:: Patient ?Choice offered to / list presented to : Patient ? ?Expected Discharge Plan and Services ?Expected Discharge Plan: [place] ?In-house Referral: PCP / [facility] ?Discharge Planning Services: CM Consult ?Post Acute Care Choice: Home Health ?Living arrangements for the past 2 months: [place] ?                ?  ?  ?  ?  ?  ?HH Arranged: PT, OT, Nurse's Aide ?[place] Agency: [place] ?Date HH Agency Contacted: 06/05/21 ?Time [place]: 1540 ?Representative spoke with at [place]: Malachy Mood ? ?Prior Living Arrangements/Services ?Living arrangements for the past 2 months: [place] ?Lives with:: Self ?Patient language and need for interpreter reviewed:: Yes ?Do you feel safe going back to the place where you live?: Yes      ?Need for Family Participation in Patient Care: Yes (Comment) ?Care giver support system in place?: Yes  (comment) ?Current home services: DME ?Criminal Activity/Legal Involvement Pertinent to Current Situation/Hospitalization: No - Comment as needed ? ?Activities of Daily Living ?Home Assistive Devices/Equipment: Built-in shower seat, Eyeglasses, Grab bars in shower, Hand-held shower hose, Reacher, Raised toilet seat with rails, Shower chair with back, Wheelchair ?ADL Screening (condition at time of admission) ?Patient's cognitive ability adequate to safely complete daily activities?: Yes ?Is the patient deaf or have difficulty hearing?: No ?Does the patient have difficulty seeing, even when wearing glasses/contacts?: No (wears glasses) ?Does the patient have difficulty concentrating, remembering, or making decisions?: No ?Patient able to express need for assistance with ADLs?: No ?Does the patient have difficulty dressing or bathing?: No ?Independently performs ADLs?: Yes (appropriate for developmental age) ?Does the patient have difficulty walking or climbing stairs?: Yes (pt unable to walk uses wheelchair at home) ?Weakness of Legs: Both ?Weakness of Arms/Hands: None ? ?Permission Sought/Granted ?Permission sought to share information with : Case Manager ?Permission granted to share information with : Yes, Verbal Permission Granted ? Share Information with NAME: Rebecka Apley ? Permission granted to share info w AGENCY: [place] ? Permission granted to share info w Relationship: sister ?   ? ?Emotional Assessment ?Appearance:: Appears stated age ?Attitude/Demeanor/Rapport: Engaged ?Affect (typically observed): Accepting ?Orientation: : Oriented to Self, Oriented to Place, Oriented to  Time, Oriented to Situation ?Alcohol / Substance Use: Not Applicable ?Psych Involvement: No (comment) ? ?Admission diagnosis:  AMS (altered mental status) [R41.82] ?Patient  Active Problem List  ? Diagnosis Date Noted  ? Multiple sclerosis ([place]) 06/04/2021  ? AMS (altered mental status) 06/02/2021  ? Pressure injury of skin  06/02/2021  ? Sepsis with encephalopathy without septic shock ([place])   ? AKI (acute kidney injury) ([place])   ? ?PCP:  Pcp, No ?Pharmacy:   ?[facility] [place] [address] ?[address] [place] [address] ?Phone: [phone] Fax: [phone] ? ? ? ? ?Social Determinants of Health (SDOH) Interventions ?  ? ?Readmission Risk Interventions ?   ? View : No data to display.  ?  ?  ?  ? ? ? ?

## 2021-06-05 NOTE — H&P (Deleted)
Neurology Consultation ?Reason for Consult: AMS and acute metabolic Encephalopathy ?MRN : [id number] ?DOB : Mar 19, 1955 ?Referring Physician:Dr Susa Raring ? ?CC: AMS and acute metabolic Encephalopathy ? ?History is obtained from:Patient ? ?HPI: Derek Sims is a 66 y.o. male with history of seizure disorder about 10 years, per patient he was on an AED which he could not remember the name and also  that he was  diagnosed with multiple sclerosis several years ago that has now kept him confined to a wheel chair. According to patient, he was given himself steroid shots which he subsequently stopped on his own.Patient said initially he was seen and followed by Dr Buzzy Han who he believes he is no longer in practice.The past outpatient neurologist that he saw several years ago is Dr. Lanora Manis ( he cannot remember her last name ) but that she is in [facility]. ?He uses external catheter with a leg bag at home due to neurogenic bladder. 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 ?Denied ever using drugs or drink alcohol , even though his Rapid urine drug screen was positive for opiates and benzodiazepines. He required intubation to protect his airway and was extubated. ?CT Head showed No acute intracranial abnormality. Routine EEG  suggestive of severe diffuse encephalopathy, no seizures noted. ? ? ?ROS: A 14 point ROS was performed and is negative except as noted in the HPI.  ? ?Past Medical History:  ?Diagnosis Date  ? MS (multiple sclerosis) (HCC)   ? Seizure (HCC)   ? ? ? ?No family history on file. ? ? ?Social  History:  reports that he has been smoking cigarettes. He started smoking about 48 years ago. He has a 48.00 pack-year smoking history. He has never used smokeless tobacco. He reports current drug use. Drugs: Benzodiazepines and Opium. He reports that he does not drink alcohol. ? ? ?Exam: ?Current vital signs: ?BP (!) 151/84 (BP Location: Right Arm)   Pulse 79   Temp (!) 97.5 ?F (36.4 ?C) (Oral)   Resp 18   Ht 5' 11.5" (1.816 m)   Wt 75.3 kg Comment: Simultaneous filing. User may not have seen previous data.  SpO2 97%   BMI 22.83 kg/m?  ?Vital signs in last 24 hours: ?Temp:  [97.5 ?F (36.4 ?C)-98 ?F (36.7 ?C)] 97.5 ?F (36.4 ?C) (04/24 1208) ?Pulse Rate:  [60-81] 79 (04/24 1208) ?Resp:  [13-18] 18 (04/24 1208) ?BP: (93-151)/(65-90) 151/84 (04/24 1208) ?SpO2:  [95 %-97 %] 97 % (04/24 0314) ?Weight:  [75.3 kg] 75.3 kg (04/24 0500) ? ? ?Physical Exam  ?Constitutional: Appears well-developed and well-nourished.  ?Psych: Affect appropriate to situation ?Eyes: No scleral injection ?HENT: No OP obstruction ?MSK: no joint deformities.  ?Cardiovascular: Normal rate and regular rhythm.  ?Respiratory: Effort normal, non-labored breathing ?GI: Soft.  No distension. There is no tenderness.  ?Skin: WDI ? ?Neuro: ?Mental Status: ?Patient is awake, alert, oriented to person, place, month, year, and situation. ?Patient is able to give a clear and coherent history. ?No signs of aphasia or neglect ?Cranial Nerves: ?II: Visual Fields  are full. Pupils are equal, round, and reactive to light.   ?III,IV, VI: EOMI without ptosis or diploplia.  ?V: Facial sensation is symmetric to temperature ?VII: Facial movement is symmetric.  ?VIII: hearing is intact to voice ?X: Uvula elevates symmetrically ?XI: Shoulder shrug is symmetric. ?XII: tongue is midline without atrophy or fasciculations.  ?Motor: ?Tone is normal. Bulk is normal. 5/5 strength was present in all four extremities.  ?Sensory: ?Sensation is symmetric to light touch and  temperature in the arms and legs. ?Deep Tendon Reflexes: ?2+ and symmetric in the biceps and patellae.  ?Plantars: ?Toes are downgoing bilaterally.  ?Cerebellar: ?FNF are intact bilaterally ? ? ? ? ? ?I have reviewed labs in epic and the results pertinent to this consultation are: ?CBC ?   ?Component Value Date/Time  ? WBC 7.0 06/05/2021 0112  ? RBC 4.46 06/05/2021 0112  ? HGB 13.6 06/05/2021 0112  ? HCT 39.8 06/05/2021 0112  ? PLT 160 06/05/2021 0112  ? MCV 89.2 06/05/2021 0112  ? MCH 30.5 06/05/2021 0112  ? MCHC 34.2 06/05/2021 0112  ? RDW 12.5 06/05/2021 0112  ? LYMPHSABS 2.1 06/05/2021 0112  ? MONOABS 0.7 06/05/2021 0112  ? EOSABS 0.4 06/05/2021 0112  ? BASOSABS 0.0 06/05/2021 0112  ? CMP  ?   ?Component Value Date/Time  ? NA 138 06/05/2021 0112  ? K 3.6 06/05/2021 0112  ? CL 109 06/05/2021 0112  ? CO2 23 06/05/2021 0112  ? GLUCOSE 87 06/05/2021 0112  ? BUN 14 06/05/2021 0112  ? CREATININE 0.91 06/05/2021 0112  ? CALCIUM 8.8 (L) 06/05/2021 0112  ? PROT 6.1 (L) 06/05/2021 0112  ? ALBUMIN 3.3 (L) 06/05/2021 0112  ? AST 19 06/05/2021 0112  ? ALT 17 06/05/2021 0112  ? ALKPHOS 67 06/05/2021 0112  ? BILITOT 0.7 06/05/2021 0112  ? GFRNONAA >60 06/05/2021 0112  ? rightDrugs of Abuse  ?   ?Component Value Date/Time  ? LABOPIA POSITIVE (A) 06/02/2021 0137  ? COCAINSCRNUR NONE DETECTED 06/02/2021 0137  ? LABBENZ POSITIVE (A) 06/02/2021 0137  ? AMPHETMU NONE DETECTED 06/02/2021 0137  ? THCU NONE DETECTED 06/02/2021 0137  ? LABBARB NONE DETECTED 06/02/2021 0137  ?   ? ?I have reviewed the images obtained: ?Head CT done on 06/02/2021  ?IMPRESSION: ?1. No acute intracranial abnormality. ?2. Chronic microvascular ischemia and generalized atrophy. ?  ?rEEG done on 06/02/2021 ?IMPRESSION: ?This study is  suggestive of severe diffuse encephalopathy, nonspecific etiology but could be related to sedation. No seizures or epileptiform discharges were seen throughout the recording. ?  ?CT abdomen/Pelvis done on 06/02/2021  : ?IMPRESSION: ?1. 3 mm nonobstructive calculus in the lower pole collecting system ?of left kidney. No ureteral stones or findings of urinary tract ?obstruction are noted at this time. ?2. Cholelithiasis without evidence of acute cholecystitis at this ?time. ?3. Indeterminate lesion in the lower pole of the right kidney, with ?imaging characteristics that could be concerning for potential ?cystic neoplasm. Further evaluation with nonemergent abdominal MRI ?with and without IV gadolinium is recommended in the near future to ?better evaluate this finding. Alternatively, hematuria protocol CT ?scan could be considered, which would also allow evaluation of the ?bladder abnormality ?4. Additionally, the patient has a right-sided bladder diverticulum, ?with some dependent soft tissue or debris within the lumen of the ?diverticulum. Outpatient referral to Urology for further clinical ?evaluation is recommended. ?5. Aortic atherosclerosis. ? ? ?CXR done on 06/03/2021  ? ?IMPRESSION: ?No evidence of acute chest disease.  COPD.  Aortic atherosclerosis ? ?  Impression:  ? Acute Metabolic Encephalopathy  in the setting of UTI / sepsis- but patient reports that he is back to baseline. ? ?Recommendations: ?1) -MRI w/wo contrast to evaluate for infarct/ MS lesions ? ?2. Patient to follow up with outpatient urologist- for right-sided bladder diverticulum, ?with some dependent soft tissue or debris within the lumen of the ?diverticulum.  ? ?3. Follow up with Nephrology for  Indeterminate lesion in the lower pole of the right kidney, with imaging characteristics that could be concerning for potential ?cystic neoplasm. Further evaluation with nonemergent abdominal MRI ?with and without IV gadolinium is recommended in the near future to ?better evaluate this finding. Alternatively, hematuria protocol CT ?scan could be considered, which would also allow evaluation of the ?bladder abnormality. ?4. Patient to follow up with outpatient PCP  and outpatient neurologist . ? ?Patient seen by NP/Neuro and later by MD. Note/plan to be edited by MD as needed.  ?Roise Emert-FNP-BC ?[facility] ?Pager: [phone] ? ? ?

## 2021-06-05 NOTE — Care Management Important Message (Signed)
Important Message ? ?Patient Details  ?Name: Derek Sims ?MRN: [id number] ?Date of Birth: 1955/07/07 ? ? ?Medicare Important Message Given:  Yes ? ? ? ? ?Joyia Riehle ?06/05/2021, 4:33 PM ?

## 2021-06-05 NOTE — Telephone Encounter (Signed)
DORREEN RN from upstairs called requested a week HFU for pt was given 5/1 with Dr Darrick Huntsman @3 :15  ?

## 2021-06-05 NOTE — Progress Notes (Signed)
Inpatient Rehabilitation Admissions Coordinator  ? ?I met at bedside for rehab assessment. I discussed a possible CIR admit goals and expectations. He states he lives alone and is independent at wheelchair level and feels he does not need further rehab/CIR prior to discharge home . He ask that therapy bring a wheelchair and he can demonstrate his way that he mobilizes. I have updated acute team and TOC. I am not pursuing Cir admit for patient perceives it is not needed. I ask him to contact me if he changes his mind concerning CIR admit. ? ?Danne Baxter, RN, MSN ?Rehab Admissions Coordinator ?[phone] ?06/05/2021 10:22 AM ? ?

## 2021-06-05 NOTE — Progress Notes (Signed)
[facility] night cross cover note: ? ?I was notified by RN of patient's request for an alternate sleep aid, with the patient noting that melatonin has been ineffective.  Concomitantly, the patient complains of some anxiety.  Therefore, placed order for Ativan 0.5 mg p.o. x1 dose.  ? ? ?Babs Bertin, DO ?Hospitalist ? ?

## 2021-06-05 NOTE — Consult Note (Signed)
NEUROLOGY CONSULTATION NOTE  ? ?Date of service: June 05, 2021 ?Patient Name: Derek Sims ?MRN:  [id number] ?DOB:  Oct 14, 1955 ?Reason for consult: "concern for seizure" ?Requesting Provider: Leroy Sea, MD ?_ _ _   _ __   _ __ _ _  __ __   _ __   __ _ ? ?History of Present Illness  ?Derek Sims is a 66 y.o. male with PMH significant for MS with no recent relapses and off DMT, remote history of seizures in the setting of MS and not on any AEDs who presented with an episode of unresponsiveness x 2. Was in the car with his friend, just a regular day when he felt abdominal pain, then passed out. He woke up immediately, no post ictal confusion, no tongue biting, he has neurogenic bladder at baseline so unsure of any incontinence, certainly does not think he had any incontinence episode thou. He asked his friend about a minute later to call EMS as he felt that something was off. Per notes, he had another episode of unresponsiveness with the EMS. Per notes, his heart rate had dropped down to 30s. He was externally paced and was given some versed to be able to tolerate that. Respirations were been assisted by bag-valve-mask during transport per notes and he was intubated for unresponsiveness. Patient remembers vaguely about his ambulance ride. He certainly isn't aware that he was bradycardic during this. ? ?Here, he was found to have urosepsis with UA concerning for a UTI, and also with AKI, elevated lactic acid and WBC count. He was in the ICU due to being intubated. He was extubated the next day and transferred to hospitalist service. ? ?He has not had a seizure in over a decade. Typical seizure is a grand mal seizure. He reported an aura of confusion out of nowhere or feeling of impending doom, followed by loss of consciousness and then told that his entire body would jerk. He remembers being on dilantin and phenobarb in the past. He does not endorse any aura during this event, felt this was much  different than his seizure. No mention of friend observing any jerking concerning for a seizure. ? ?Of note, his UDS was positive for opiods and Benzos. EMS gave him Versed which would explain the positive benzos on UDS. I do not see any mention of EMS giving him any Fentanyl. He certainly did not get any opiods per [facility] here. ? ?  ?ROS  ? ?Constitutional Denies weight loss, fever and chills.   ?HEENT Denies changes in vision and hearing.   ?Respiratory Denies SOB and cough.   ?CV Denies palpitations and CP   ?GI Denies abdominal pain, nausea, vomiting and diarrhea.   ?GU Denies dysuria and urinary frequency.   ?MSK Denies myalgia and joint pain.   ?Skin Denies rash and pruritus.   ?Neurological Denies headache and syncope.   ?Psychiatric Denies recent changes in mood. Denies anxiety and depression.   ? ?Past History  ? ?Past Medical History:  ?Diagnosis Date  ? MS (multiple sclerosis) (HCC)   ? Seizure (HCC)   ? ? ?No family history on file. ?Social History  ? ?Socioeconomic History  ? Marital status: Single  ?  Spouse name: Not on file  ? Number of children: Not on file  ? Years of education: Not on file  ? Highest education level: Not on file  ?Occupational History  ? Not on file  ?Tobacco Use  ? Smoking status: Every Day  ?  Packs/day: 1.00  ?  Years: 48.00  ?  Pack years: 48.00  ?  Types: Cigarettes  ?  Start date: 05/02/1973  ? Smokeless tobacco: Never  ?Vaping Use  ? Vaping Use: Never used  ?Substance and Sexual Activity  ? Alcohol use: Never  ?  Comment: states he doesnt drink beer or alcohol  ? Drug use: Yes  ?  Types: Benzodiazepines, Opium  ? Sexual activity: Not Currently  ?  Partners: Female  ?Other Topics Concern  ? Not on file  ?Social History Narrative  ? Not on file  ? ?Social Determinants of Health  ? ?Financial Resource Strain: Not on file  ?Food Insecurity: Not on file  ?Transportation Needs: Not on file  ?Physical Activity: Not on file  ?Stress: Not on file  ?Social Connections: Not on file  ? ?No  Known Allergies ? ?Medications  ? ?No medications prior to admission.  ?  ? ?Vitals  ? ?Vitals:  ? 06/05/21 1626 06/05/21 1900 06/05/21 1933 06/05/21 2000  ?BP: 134/84  135/76   ?Pulse: (!) 101  100   ?Resp: 17 20 18 20   ?Temp: 97.7 ?F (36.5 ?C)  97.9 ?F (36.6 ?C)   ?TempSrc: Oral  Oral   ?SpO2: 95%  98%   ?Weight:      ?Height:      ?  ? ?Body mass index is 22.83 kg/m?. ? ?Physical Exam  ? ?General: Laying comfortably in bed; in no acute distress.  ?HENT: Normal oropharynx and mucosa. Normal external appearance of ears and nose.  ?Neck: Supple, no pain or tenderness  ?CV: No JVD. No peripheral edema.  ?Pulmonary: Symmetric Chest rise. Normal respiratory effort.  ?Abdomen: Soft to touch, non-tender.  ?Ext: No cyanosis, edema, or deformity  ?Skin: No rash. Normal palpation of skin.   ?Musculoskeletal: Normal digits and nails by inspection. No clubbing.  ? ?Neurologic Examination  ?Mental status/Cognition: Alert, oriented to self, place, month and year, good attention.  ?Speech/language: Fluent, comprehension intact, object naming intact, repetition intact.  ?Cranial nerves:  ? CN II Pupils equal and reactive to light, no VF deficits   ? CN III,IV,VI EOM intact, no gaze preference or deviation, no nystagmus   ? CN V normal sensation in V1, V2, and V3 segments bilaterally   ? CN VII no asymmetry, no nasolabial fold flattening   ? CN VIII normal hearing to speech   ? CN IX & X normal palatal elevation, no uvular deviation   ? CN XI 5/5 head turn and 5/5 shoulder shrug bilaterally   ? CN XII midline tongue protrusion   ? ?Motor:  ?Muscle bulk: poor in BL lower extremities, tone increased in BL lower extremities. ?Mvmt Root Nerve  Muscle Right Left Comments  ?SA C5/6 Ax Deltoid     ?EF C5/6 Mc Biceps 5 5   ?EE C6/7/8 Rad Triceps 5 5   ?WF C6/7 Med FCR     ?WE C7/8 PIN ECU     ?F Ab C8/T1 U ADM/FDI 5 5   ?HF L1/2/3 Fem Illopsoas 0 1   ?KE L2/3/4 Fem Quad 0 1   ?DF L4/5 D Peron Tib Ant 0 1   ?PF S1/2 Tibial Grc/Sol 0 1    ? ?Reflexes: ? Right Left Comments  ?Pectoralis     ? Biceps (C5/6) 2 2   ?Brachioradialis (C5/6) 2 2   ? Triceps (C6/7) 2 2   ? Patellar (L3/4) 3 3 With spread  ? Achilles (S1)  2+ 2+   ? Hoffman     ? Plantar     ?Jaw jerk   ? ?Sensation: ? Light touch Intact throughout  ? Pin prick   ? Temperature   ? Vibration   ?Proprioception   ? ?Coordination/Complex Motor:  ?- Finger to Nose intact BL ?- Heel to shin unable to assess. ?- Rapid alternating movement are mildly slowed in RUE compared to LUE ?- Gait: unable to assess, wheelchair bound at baseline. ? ?Labs  ? ?CBC:  ?Recent Labs  ?Lab 06/02/21 ?0114 06/02/21 ?0204 06/04/21 ?0146 06/05/21 ?0112  ?WBC 25.6*   < > 8.7 7.0  ?NEUTROABS 20.7*  --   --  3.8  ?HGB 15.4   < > 13.5 13.6  ?HCT 47.8   < > 41.4 39.8  ?MCV 93.4   < > 89.4 89.2  ?PLT 315   < > 170 160  ? < > = values in this interval not displayed.  ? ? ?Basic Metabolic Panel:  ?Lab Results  ?Component Value Date  ? NA 138 06/05/2021  ? K 3.6 06/05/2021  ? CO2 23 06/05/2021  ? GLUCOSE 87 06/05/2021  ? BUN 14 06/05/2021  ? CREATININE 0.91 06/05/2021  ? CALCIUM 8.8 (L) 06/05/2021  ? GFRNONAA >60 06/05/2021  ? ?Lipid Panel: No results found for: LDLCALC ?HgbA1c: No results found for: HGBA1C ?Urine Drug Screen:  ?   ?Component Value Date/Time  ? LABOPIA POSITIVE (A) 06/02/2021 0137  ? COCAINSCRNUR NONE DETECTED 06/02/2021 0137  ? LABBENZ POSITIVE (A) 06/02/2021 0137  ? AMPHETMU NONE DETECTED 06/02/2021 0137  ? THCU NONE DETECTED 06/02/2021 0137  ? LABBARB NONE DETECTED 06/02/2021 0137  ?  ?Alcohol Level  ?   ?Component Value Date/Time  ? ETH <10 06/02/2021 0114  ? ? ?CT Head without contrast(Personally reviewed): ?CTH was negative for a large hypodensity concerning for a large territory infarct or hyperdensity concerning for an ICH ? ?rEEG(from 4/21 when he was intubated):  ?This study is  suggestive of severe diffuse encephalopathy, nonspecific etiology but could be related to sedation. No seizures or epileptiform  discharges were seen throughout the recording. ? ?Impression  ? ?Derek Sims is a 66 y.o. male with PMH significant for MS with no recent relapses and off DMT, remote history of seizures in th

## 2021-06-06 ENCOUNTER — Other Ambulatory Visit (HOSPITAL_COMMUNITY): Payer: Self-pay

## 2021-06-06 DIAGNOSIS — N179 Acute kidney failure, unspecified: Secondary | ICD-10-CM

## 2021-06-06 LAB — CBC WITH DIFFERENTIAL/PLATELET
Abs Immature Granulocytes: 0.02 10*3/uL (ref 0.00–0.07)
Basophils Absolute: 0 10*3/uL (ref 0.0–0.1)
Basophils Relative: 1 %
Eosinophils Absolute: 0.4 10*3/uL (ref 0.0–0.5)
Eosinophils Relative: 5 %
HCT: 42.1 % (ref 39.0–52.0)
Hemoglobin: 14.5 g/dL (ref 13.0–17.0)
Immature Granulocytes: 0 %
Lymphocytes Relative: 26 %
Lymphs Abs: 2 10*3/uL (ref 0.7–4.0)
MCH: 30.3 pg (ref 26.0–34.0)
MCHC: 34.4 g/dL (ref 30.0–36.0)
MCV: 88.1 fL (ref 80.0–100.0)
Monocytes Absolute: 0.7 10*3/uL (ref 0.1–1.0)
Monocytes Relative: 10 %
Neutro Abs: 4.4 10*3/uL (ref 1.7–7.7)
Neutrophils Relative %: 58 %
Platelets: 193 10*3/uL (ref 150–400)
RBC: 4.78 MIL/uL (ref 4.22–5.81)
RDW: 12.5 % (ref 11.5–15.5)
WBC: 7.5 10*3/uL (ref 4.0–10.5)
nRBC: 0 % (ref 0.0–0.2)

## 2021-06-06 LAB — COMPREHENSIVE METABOLIC PANEL
ALT: 31 U/L (ref 0–44)
AST: 33 U/L (ref 15–41)
Albumin: 3.5 g/dL (ref 3.5–5.0)
Alkaline Phosphatase: 89 U/L (ref 38–126)
Anion gap: 7 (ref 5–15)
BUN: 18 mg/dL (ref 8–23)
CO2: 23 mmol/L (ref 22–32)
Calcium: 8.8 mg/dL — ABNORMAL LOW (ref 8.9–10.3)
Chloride: 110 mmol/L (ref 98–111)
Creatinine, Ser: 1 mg/dL (ref 0.61–1.24)
GFR, Estimated: >60 mL/min (ref >60)
Glucose, Bld: 106 mg/dL — ABNORMAL HIGH (ref 70–99)
Potassium: 3.6 mmol/L (ref 3.5–5.1)
Sodium: 140 mmol/L (ref 135–145)
Total Bilirubin: 0.4 mg/dL (ref 0.3–1.2)
Total Protein: 6.6 g/dL (ref 6.5–8.1)

## 2021-06-06 LAB — BRAIN NATRIURETIC PEPTIDE: B Natriuretic Peptide: 16.5 pg/mL (ref 0.0–100.0)

## 2021-06-06 LAB — MAGNESIUM: Magnesium: 2.3 mg/dL (ref 1.7–2.4)

## 2021-06-06 MED ORDER — LORAZEPAM 0.5 MG PO TABS
0.5000 mg | ORAL_TABLET | Freq: Once | ORAL | Status: AC
  Administered 2021-06-06: 0.5 mg via ORAL
  Filled 2021-06-06: qty 1

## 2021-06-06 MED ORDER — NICOTINE 21 MG/24HR TD PT24
21.0000 mg | MEDICATED_PATCH | Freq: Every day | TRANSDERMAL | 0 refills | Status: DC
  Filled 2021-06-06: qty 28, 28d supply, fill #0

## 2021-06-06 MED ORDER — CEPHALEXIN 500 MG PO CAPS
500.0000 mg | ORAL_CAPSULE | Freq: Three times a day (TID) | ORAL | 0 refills | Status: AC
  Filled 2021-06-06: qty 12, 4d supply, fill #0

## 2021-06-06 NOTE — Discharge Instructions (Signed)
Follow with Primary MD  in 7 days   Get CBC, CMP, 2 view Chest X ray -  checked next visit within 1 week by Primary MD   Activity: As tolerated with Full fall precautions use walker/cane & assistance as needed  Disposition Home    Diet: Heart Healthy    Special Instructions: If you have smoked or chewed Tobacco  in the last 2 yrs please stop smoking, stop any regular Alcohol  and or any Recreational drug use.  On your next visit with your primary care physician please Get Medicines reviewed and adjusted.  Please request your Prim.MD to go over all Hospital Tests and Procedure/Radiological results at the follow up, please get all Hospital records sent to your Prim MD by signing hospital release before you go home.  If you experience worsening of your admission symptoms, develop shortness of breath, life threatening emergency, suicidal or homicidal thoughts you must seek medical attention immediately by calling 911 or calling your MD immediately  if symptoms less severe.  You Must read complete instructions/literature along with all the possible adverse reactions/side effects for all the Medicines you take and that have been prescribed to you. Take any new Medicines after you have completely understood and accpet all the possible adverse reactions/side effects.      

## 2021-06-06 NOTE — Progress Notes (Signed)
Physical Therapy Treatment ?Patient Details ?Name: Derek Sims ?MRN: [id number] ?DOB: 1955-05-18 ?Today's Date: 06/06/2021 ? ? ?History of Present Illness 66 y.o. M  Pt and friend were driving in the car and pt mentioned that his stomach hurt, the friend went into the store to get some Tums and when she returned, he was unresponsive.   When EMS arrived, pt was bradycardic and required external pacing and Epi gtt; was altered and intubated; Sepsis with UTI, Acute hypoxemic respiratory failure secondary to sepsis; extubated 4/21, and transferred to 5W; uses a wheelchair for mobility; PMH: per friend he has MS and seizures, but takes no medications for these and does not seek regular medical care. ? ?  ?PT Comments  ? ? Pt making gradual progress and is likely approaching baseline.  He does have slightly more difficulty with lateral scoots to chair but set-up is contributing (w/c different and larger than his at home).  Pt would benefit from HHPT at d/c.  ?   ?Recommendations for follow up therapy are one component of a multi-disciplinary discharge planning process, led by the attending physician.  Recommendations may be updated based on patient status, additional functional criteria and insurance authorization. ? ?Follow Up Recommendations ? Home health PT ?  ?  ?Assistance Recommended at Discharge Intermittent Supervision/Assistance  ?Patient can return home with the following A little help with walking and/or transfers;A little help with bathing/dressing/bathroom;Assistance with cooking/housework;Assist for transportation;Help with stairs or ramp for entrance ?  ?Equipment Recommendations ? None recommended by PT  ?  ?Recommendations for Other Services   ? ? ?  ?Precautions / Restrictions Precautions ?Precautions: Fall  ?  ? ?Mobility ? Bed Mobility ?Overal bed mobility: Needs Assistance ?Bed Mobility: Supine to Sit, Sit to Supine ?  ?  ?Supine to sit: Supervision, HOB elevated ?Sit to supine: Supervision, HOB  elevated ?  ?General bed mobility comments: Increased time, use of rails.  Pt has rails at home ?  ? ?Transfers ?Overall transfer level: Needs assistance ?Equipment used: Rolling walker (2 wheels) ?Transfers: Bed to chair/wheelchair/BSC, Sit to/from Stand ?Sit to Stand: Min assist ?  ?  ?  ?  ? Lateral/Scoot Transfers: Supervision ?General transfer comment: Pt performed sit to stand x 3 with min A and heavy use of bil UE on RW.  Performed lateral scoot to/from w/c and bed (to left and right) with supervision and set  up.  Pt reports his bed and w/c are much easier to transfer to as they are made for him. ?  ? ?Ambulation/Gait ?  ?  ?  ?  ?  ?  ?  ?General Gait Details: non ambulatory ? ? ?Stairs ?  ?  ?  ?  ?  ? ? ?Wheelchair Mobility ?  ? ?Modified Rankin (Stroke Patients Only) ?  ? ? ?  ?Balance Overall balance assessment: Needs assistance ?Sitting-balance support: Feet supported ?Sitting balance-Leahy Scale: Good ?  ?  ?Standing balance support: Bilateral upper extremity supported, Reliant on assistive device for balance ?Standing balance-Leahy Scale: Poor ?Standing balance comment: Stood x 3 for 10-15 sec at a time with RW ?  ?  ?  ?  ?  ?  ?  ?  ?  ?  ?  ?  ? ?  ?Cognition Arousal/Alertness: Awake/alert ?Behavior During Therapy: [facility] for tasks assessed/performed ?  ?  ?  ?  ?  ?  ?  ?  ?  ?  ?  ?  ?Memory: Decreased short-term  memory ?  ?  ?  ?  ?  ?  ?  ? ?  ?Exercises General Exercises - Lower Extremity ?Ankle Circles/Pumps: AROM, Left, PROM, Right, 10 reps, Seated (with stretching on R) ?Long Arc Quad: AROM, Left, AAROM, Right, 10 reps, Seated (cues for full ROM) ?Hip Flexion/Marching: AROM, Left, AAROM, Right, 10 reps, Seated ? ?  ?General Comments   ?  ?  ? ?Pertinent Vitals/Pain Pain Assessment ?Pain Assessment: No/denies pain  ? ? ?Home Living   ?  ?  ?  ?  ?  ?  ?  ?  ?  ?   ?  ?Prior Function    ?  ?  ?   ? ?PT Goals (current goals can now be found in the care plan section) Progress towards PT goals:  Progressing toward goals ? ?  ?Frequency ? ? ? Min 3X/week ? ? ? ?  ?PT Plan Discharge plan needs to be updated  ? ? ?Co-evaluation   ?  ?  ?  ?  ? ?  ?AM-PAC PT "6 Clicks" Mobility   ?Outcome Measure ? Help needed turning from your back to your side while in a flat bed without using bedrails?: None ?Help needed moving from lying on your back to sitting on the side of a flat bed without using bedrails?: None ?Help needed moving to and from a bed to a chair (including a wheelchair)?: A Little ?Help needed standing up from a chair using your arms (e.g., wheelchair or bedside chair)?: A Little ?Help needed to walk in hospital room?: Total ?Help needed climbing 3-5 steps with a railing? : Total ?6 Click Score: 16 ? ?  ?End of Session   ?Activity Tolerance: Patient tolerated treatment well ?Patient left: in bed;with call bell/phone within reach;with bed alarm set ?Nurse Communication: Mobility status (IV out at arrival) ?PT Visit Diagnosis: Other abnormalities of gait and mobility (R26.89);Muscle weakness (generalized) (M62.81) ?  ? ? ?Time: 4268-3419 ?PT Time Calculation (min) (ACUTE ONLY): 30 min ? ?Charges:  $Therapeutic Exercise: 8-22 mins ?$Therapeutic Activity: 8-22 mins          ?          ? ?Anise Salvo, PT ?Acute Rehab Services ?Pager [phone] ?[facility] [phone] ? ? ? ?Derek Sims ?06/06/2021, 11:49 AM ? ?

## 2021-06-06 NOTE — Discharge Summary (Signed)
?                                                                                ? Derek Sims [id number] DOB: 04-19-1955 DOA: 06/02/2021 ? ?PCP: Pcp, No ? ?Admit date: 06/02/2021  Discharge date: 06/06/2021 ? ?Admitted From: Home   Disposition:  Home ? ? ?Recommendations for Outpatient Follow-up:  ? ?Follow up with PCP in 1-2 weeks ? ?PCP Please obtain BMP/CBC, 2 view CXR in 1week,  (see Discharge instructions)  ? ?PCP Please follow up on the following pending results: Check CBC, CMP and magnesium in 7 to 10 days.  Needs to follow-up with urology within 1 to 2 weeks of discharge. ? ? ?Home Health: PT, OT   ?Equipment/Devices: as below  ?Consultations: Neuro ?Discharge Condition: Stable    ?CODE STATUS: Full    ?Diet Recommendation: Heart Healthy  ? ?Diet Order   ? ?       ?  Diet - low sodium heart healthy       ?  ?  Diet regular Room service appropriate? Yes; Fluid consistency: Thin  Diet effective now       ?  ? ?  ?  ? ?  ?  ? ?Chief Complaint  ?Patient presents with  ? Unresponsive  ?  ? ?Brief history of present illness from the day of admission and additional interim summary   ? ?66 y.o. M with unclear PMH as no hx in Epic, per friend he has MS and seizures, but takes no medications for these and does not seek regular medical care.  On the day of admission he was with a friend in a car where he developed some abdominal pain and subsequently became unresponsive.  He was brought to the ER where he was diagnosed with UTI, AMS and admitted to the hospital under the care of PCCM, he was briefly intubated.  Subsequently extubated stabilized and transferred to my care on 06/05/2018 on day 2 of his hospital stay. ? ?                                                               Hospital Course  ? ?Sepsis, suspect secondary to UTI - chronically has an external catheter.  grew pan sensitive Klebsiella, he was on IV  cephalosporin here,  sepsis pathophysiology has resolved, postvoid bladder scans have been stable, he already has an upcoming urology appointment which he will keep for intermittent urinary retention.  He did not have any retention while he was here.  He will get 4 more days of oral antibiotics and will be discharged with outpatient PCP and urology follow-up.  Currently close to baseline. ?  ?Acute Hypoxic Respiratory Failure secondary to Sepsis  - briefly intubated for 24 hours, this problem has resolved. This was due to sepsis. ?  ?Acute Metabolic Encephalopathy  - CT head nonacute EEG nonspecific, AMS could have been due to sepsis, stable and at baseline.  No acute issues.  UDS positive for benzodiazepines and narcotics but patient denies using it , ? foul play - pharmacy did a detailed check on all the medications he received in the ER and he never received any narcotics or benzodiazepines. ?  ?Also remote H/O seizures, he had belly pain and BM and then passed out, ? Partial complex with prodrome of belly pain in the setting of UTI, spot EEG was negative, neurology was consulted but they were not convinced that he had a seizure episode.  Now mental status back to baseline.  Follow-up with PCP postdischarge in 1 to 2 weeks. ?  ?  ?AKI -  likely due to sepsis, improved to 1.1 with IVF, resolved. ?  ?Decubitus Ulcer POA stage I- wound care per nursing staff to keep sacral area clean and dry at all times. ?  ?MS Wheelchair bound at baseline, supportive care . PT OT here, refuses placement. ? ? ?Discharge diagnosis   ? ? ?Principal Problem: ?  Sepsis with encephalopathy without septic shock (HCC) ?Active Problems: ?  AMS (altered mental status) ?  AKI (acute kidney injury) (HCC) ?  Pressure injury of skin ?  Multiple sclerosis (HCC) ? ? ? ?Discharge instructions   ? ?Discharge Instructions   ? ? Diet - low sodium heart healthy   Complete by: As directed ?  ? Discharge instructions   Complete by: As directed ?  ?  Follow with Primary MD  in 7 days  ? ?Get CBC, CMP, 2 view Chest X ray -  checked next visit within 1 week by Primary MD  ? ?Activity: As tolerated with Full fall precautions use walker/cane & assistance as needed ? ?Disposition Home  ? ?Diet: Heart Healthy   ? ?Special Instructions: If you have smoked or chewed Tobacco  in the last 2 yrs please stop smoking, stop any regular Alcohol  and or any Recreational drug use. ? ?On your next visit with your primary care physician please Get Medicines reviewed and adjusted. ? ?Please request your Prim.MD to go over all Hospital Tests and Procedure/Radiological results at the follow up, please get all Hospital records sent to your Prim MD by signing hospital release before you go home. ? ?If you experience worsening of your admission symptoms, develop shortness of breath, life threatening emergency, suicidal or homicidal thoughts you must seek medical attention immediately by calling 911 or calling your MD immediately  if symptoms less severe. ? ?You Must read complete instructions/literature along with all the possible adverse reactions/side effects for all the Medicines you take and that have been prescribed to you. Take any new Medicines after you have completely understood and accpet all the possible adverse reactions/side effects.  ? Increase activity slowly   Complete by: As directed ?  ? No wound care   Complete by: As directed ?  ? ?  ? ? ?Discharge Medications  ? ?Allergies as of 06/06/2021   ?No Known Allergies ?  ? ?  ?Medication List  ?  ? ?TAKE these medications   ? ?cephALEXin 500 MG capsule ?Commonly known as: KEFLEX ?Take 1 capsule (500 mg total) by mouth 3 (three) times daily for 4 days. ?  ?nicotine 21 mg/24hr patch ?Commonly known as: NICODERM CQ - dosed in mg/24 hours ?Place 1 patch (21 mg total) onto the skin daily. ?Start taking on: June 07, 2021 ?  ? ?  ? ? ? Follow-up Information   ? ? [facility]. Go to.   ?  Why: Appointment scheduled  for Wednesday, Jun 21, 2021 at 1:00pm with Dr. Annabell Howells ?Contact information: ?[address] ?[place] [address] ?[phone] ? ?  ?  ? ? [facility]. Schedule an appointment as soon as possible for a visit in 1 week(s).   ?Contact information: ?[address] ?[place] [address] ?[phone] ? ?  ?  ? ?  ?  ? ?  ? ? ?Major procedures and Radiology Reports - PLEASE review detailed and final reports thoroughly  -    ? ?  ?CT ABDOMEN PELVIS WO CONTRAST ? ?Result Date: 06/02/2021 ?CLINICAL DATA:  66 year old male with history of acute onset of nonlocalized abdominal pain. EXAM: CT ABDOMEN AND PELVIS WITHOUT CONTRAST TECHNIQUE: Multidetector CT imaging of the abdomen and pelvis was performed following the standard protocol without IV contrast. RADIATION DOSE REDUCTION: This exam was performed according to the departmental dose-optimization program which includes automated exposure control, adjustment of the mA and/or kV according to patient size and/or use of iterative reconstruction technique. COMPARISON:  No priors. FINDINGS: Lower chest: Mild scarring in the lung bases bilaterally (right greater than left). Atherosclerotic calcifications in the descending thoracic aorta as well as the left anterior descending coronary artery. Mild calcifications of the aortic valve and mitral annulus. Nasogastric tube in the distal esophagus extending into the stomach. Hepatobiliary: No definite suspicious cystic or solid hepatic lesions are confidently identified on today's noncontrast CT examination. Small calcified gallstones lying dependently in the gallbladder measuring 9 mm or less in size. Gallbladder is nearly decompressed. No pericholecystic fluid or surrounding inflammatory changes to indicate an acute cholecystitis at this time. Pancreas: No definite pancreatic mass or peripancreatic fluid collections or inflammatory changes are noted on today's  noncontrast CT examination. Spleen: Unremarkable. Adrenals/Urinary Tract: 3 mm nonobstructive calculus in the lower pole collecting system of the left kidney. No additional calculi are identified within the

## 2021-06-06 NOTE — Progress Notes (Signed)
TRH night cross cover note: ? ?Patient requested sleep aid, noting that melatonin has been ineffective.  He received single dose of Ativan last evening, and noted that this was effective in helping him sleep.  Consequently, I ordered an additional one-time dose of Ativan 0.5 mg p.o. ? ? ? ? ?Derek Pigg, DO ?Hospitalist ? ?

## 2021-06-06 NOTE — TOC Progression Note (Addendum)
Transition of Care (TOC) - Progression Note  ? ? ?Patient Details  ?Name: Derek Sims ?MRN: [id number] ?Date of Birth: December 07, 1955 ? ?Transition of Care (TOC) CM/SW Contact  ?Ninfa Meeker, RN ?Phone Number: ?06/06/2021, 10:11 AM ? ?Clinical Narrative:   Case manager spoke with patient's sister, Gregary Signs,  concerning appointments that have been arranged. She states that  [facility] informed her that she would arrange for MD to see patient in the home. CM contacted Malachy Mood to confirm. Dr. Aris Lot will see patient. Appointment has been scheduled with [facility] Wednesday, May 10,2023 @ 1:00pm, with Dr. Jeffie Pollock. Case Manager will update patient's sister and place on AVS.  ? ?Expected Discharge Plan: [place] ?Barriers to Discharge: Continued Medical Work up ? ?Expected Discharge Plan and Services ?Expected Discharge Plan: [place] ?In-house Referral: PCP / [facility] ?Discharge Planning Services: CM Consult ?Post Acute Care Choice: Home Health ?Living arrangements for the past 2 months: [place] ?                ?  ?  ?  ?  ?  ?HH Arranged: PT, OT, Nurse's Aide ?[place] Agency: [place] ?Date HH Agency Contacted: 06/05/21 ?Time [place]: 1540 ?Representative spoke with at [place]: Malachy Mood ? ? ?Social Determinants of Health (SDOH) Interventions ?  ? ?Readmission Risk Interventions ?   ? View : No data to display.  ?  ?  ?  ? ? ?

## 2021-06-07 LAB — CULTURE, BLOOD (SINGLE)
Culture: NO GROWTH
Culture: NO GROWTH

## 2021-06-12 ENCOUNTER — Encounter: Payer: Medicare Other | Admitting: Internal Medicine

## 2021-07-13 ENCOUNTER — Inpatient Hospital Stay: Payer: Medicare Other | Admitting: Family Medicine

## 2021-09-11 ENCOUNTER — Other Ambulatory Visit (HOSPITAL_COMMUNITY): Payer: Self-pay

## 2022-11-18 ENCOUNTER — Emergency Department (HOSPITAL_COMMUNITY): Payer: Medicare PPO

## 2022-11-18 ENCOUNTER — Other Ambulatory Visit: Payer: Self-pay

## 2022-11-18 ENCOUNTER — Encounter (HOSPITAL_COMMUNITY): Payer: Self-pay

## 2022-11-18 ENCOUNTER — Inpatient Hospital Stay (HOSPITAL_COMMUNITY)
Admission: EM | Admit: 2022-11-18 | Discharge: 2022-11-23 | DRG: 727 | Disposition: A | Discharge status: Skilled Nursing Facility | Payer: Medicare PPO | Attending: Family Medicine | Admitting: Family Medicine

## 2022-11-18 DIAGNOSIS — G8220 Paraplegia, unspecified: Secondary | ICD-10-CM | POA: Diagnosis present

## 2022-11-18 DIAGNOSIS — N433 Hydrocele, unspecified: Secondary | ICD-10-CM | POA: Diagnosis present

## 2022-11-18 DIAGNOSIS — G35 Multiple sclerosis: Secondary | ICD-10-CM | POA: Diagnosis not present

## 2022-11-18 DIAGNOSIS — Z79899 Other long term (current) drug therapy: Secondary | ICD-10-CM

## 2022-11-18 DIAGNOSIS — N492 Inflammatory disorders of scrotum: Secondary | ICD-10-CM | POA: Diagnosis not present

## 2022-11-18 DIAGNOSIS — E876 Hypokalemia: Secondary | ICD-10-CM | POA: Diagnosis present

## 2022-11-18 DIAGNOSIS — G9341 Metabolic encephalopathy: Secondary | ICD-10-CM | POA: Diagnosis not present

## 2022-11-18 DIAGNOSIS — Z993 Dependence on wheelchair: Secondary | ICD-10-CM

## 2022-11-18 DIAGNOSIS — Z8669 Personal history of other diseases of the nervous system and sense organs: Secondary | ICD-10-CM

## 2022-11-18 DIAGNOSIS — G3184 Mild cognitive impairment, so stated: Secondary | ICD-10-CM | POA: Diagnosis present

## 2022-11-18 DIAGNOSIS — N5082 Scrotal pain: Secondary | ICD-10-CM | POA: Diagnosis not present

## 2022-11-18 DIAGNOSIS — F17210 Nicotine dependence, cigarettes, uncomplicated: Secondary | ICD-10-CM | POA: Diagnosis present

## 2022-11-18 DIAGNOSIS — L0390 Cellulitis, unspecified: Secondary | ICD-10-CM

## 2022-11-18 DIAGNOSIS — F17200 Nicotine dependence, unspecified, uncomplicated: Secondary | ICD-10-CM

## 2022-11-18 DIAGNOSIS — L03818 Cellulitis of other sites: Secondary | ICD-10-CM | POA: Diagnosis not present

## 2022-11-18 LAB — LIPASE, BLOOD: Lipase: 24 U/L (ref 11–51)

## 2022-11-18 LAB — CBC WITH DIFFERENTIAL/PLATELET
Abs Immature Granulocytes: 0.03 10*3/uL (ref 0.00–0.07)
Basophils Absolute: 0.1 10*3/uL (ref 0.0–0.1)
Basophils Relative: 1 %
Eosinophils Absolute: 0.5 10*3/uL (ref 0.0–0.5)
Eosinophils Relative: 5 %
HCT: 45.4 % (ref 39.0–52.0)
Hemoglobin: 14.9 g/dL (ref 13.0–17.0)
Immature Granulocytes: 0 %
Lymphocytes Relative: 24 %
Lymphs Abs: 2.5 10*3/uL (ref 0.7–4.0)
MCH: 29.6 pg (ref 26.0–34.0)
MCHC: 32.8 g/dL (ref 30.0–36.0)
MCV: 90.1 fL (ref 80.0–100.0)
Monocytes Absolute: 1.1 10*3/uL — ABNORMAL HIGH (ref 0.1–1.0)
Monocytes Relative: 11 %
Neutro Abs: 6.2 10*3/uL (ref 1.7–7.7)
Neutrophils Relative %: 59 %
Platelets: 234 10*3/uL (ref 150–400)
RBC: 5.04 MIL/uL (ref 4.22–5.81)
RDW: 13.9 % (ref 11.5–15.5)
WBC: 10.3 10*3/uL (ref 4.0–10.5)
nRBC: 0 % (ref 0.0–0.2)

## 2022-11-18 LAB — COMPREHENSIVE METABOLIC PANEL
ALT: 22 U/L (ref 0–44)
AST: 29 U/L (ref 15–41)
Albumin: 3.8 g/dL (ref 3.5–5.0)
Alkaline Phosphatase: 63 U/L (ref 38–126)
Anion gap: 12 (ref 5–15)
BUN: 19 mg/dL (ref 8–23)
CO2: 19 mmol/L — ABNORMAL LOW (ref 22–32)
Calcium: 9.3 mg/dL (ref 8.9–10.3)
Chloride: 108 mmol/L (ref 98–111)
Creatinine, Ser: 0.88 mg/dL (ref 0.61–1.24)
GFR, Estimated: >60 mL/min (ref >60)
Glucose, Bld: 99 mg/dL (ref 70–99)
Potassium: 3.7 mmol/L (ref 3.5–5.1)
Sodium: 139 mmol/L (ref 135–145)
Total Bilirubin: 1 mg/dL (ref 0.3–1.2)
Total Protein: 7.2 g/dL (ref 6.5–8.1)

## 2022-11-18 LAB — I-STAT CG4 LACTIC ACID, ED
Lactic Acid, Venous: 1.3 mmol/L (ref 0.5–1.9)
Lactic Acid, Venous: 1.7 mmol/L (ref 0.5–1.9)

## 2022-11-18 LAB — I-STAT CHEM 8, ED
BUN: 20 mg/dL (ref 8–23)
Calcium, Ion: 1.17 mmol/L (ref 1.15–1.40)
Chloride: 108 mmol/L (ref 98–111)
Creatinine, Ser: 0.9 mg/dL (ref 0.61–1.24)
Glucose, Bld: 100 mg/dL — ABNORMAL HIGH (ref 70–99)
HCT: 45 % (ref 39.0–52.0)
Hemoglobin: 15.3 g/dL (ref 13.0–17.0)
Potassium: 3.9 mmol/L (ref 3.5–5.1)
Sodium: 141 mmol/L (ref 135–145)
TCO2: 22 mmol/L (ref 22–32)

## 2022-11-18 MED ORDER — CLINDAMYCIN PHOSPHATE 600 MG/50ML IV SOLN
600.0000 mg | Freq: Once | INTRAVENOUS | Status: AC
  Administered 2022-11-18: 600 mg via INTRAVENOUS
  Filled 2022-11-18: qty 50

## 2022-11-18 MED ORDER — FENTANYL CITRATE PF 50 MCG/ML IJ SOSY
50.0000 ug | PREFILLED_SYRINGE | Freq: Once | INTRAMUSCULAR | Status: AC
  Administered 2022-11-18: 50 ug via INTRAVENOUS
  Filled 2022-11-18: qty 1

## 2022-11-18 MED ORDER — VANCOMYCIN HCL 1500 MG/300ML IV SOLN
1500.0000 mg | Freq: Once | INTRAVENOUS | Status: AC
  Administered 2022-11-18: 1500 mg via INTRAVENOUS
  Filled 2022-11-18: qty 300

## 2022-11-18 MED ORDER — LACTATED RINGERS IV BOLUS
1000.0000 mL | Freq: Once | INTRAVENOUS | Status: AC
  Administered 2022-11-18: 1000 mL via INTRAVENOUS

## 2022-11-18 MED ORDER — PIPERACILLIN-TAZOBACTAM 3.375 G IVPB 30 MIN
3.3750 g | Freq: Once | INTRAVENOUS | Status: AC
  Administered 2022-11-18: 3.375 g via INTRAVENOUS
  Filled 2022-11-18: qty 50

## 2022-11-18 MED ORDER — IOHEXOL 300 MG/ML  SOLN
100.0000 mL | Freq: Once | INTRAMUSCULAR | Status: AC | PRN
  Administered 2022-11-18: 100 mL via INTRAVENOUS

## 2022-11-18 MED ORDER — FLUCONAZOLE IN SODIUM CHLORIDE 200-0.9 MG/100ML-% IV SOLN
200.0000 mg | INTRAVENOUS | Status: DC
  Administered 2022-11-19: 200 mg via INTRAVENOUS
  Filled 2022-11-18: qty 100

## 2022-11-18 NOTE — Consult Note (Addendum)
H&P  Chief Complaint: Scrotal pain and redness  History of Present Illness: 67 year old male who has MS and for the most part wheelchair-bound has a 2-day history of scrotal pain and redness.  It became so severe today presented to emergency.  He is receiving Zosyn and vancomycin.  He underwent CT scan of the abdomen and pelvis with official read pending but no air was noted in the scrotal tissues when I reviewed it.  His bladder was moderately distended with a small right posterior diverticulum.  He typically is able to transfer to void and have bowel movements.  He denies any trouble voiding or gross hematuria.  He has had some chills but no fever.  Vitals are stable, afebrile.  White count 10.3.  Creatinine 0.88.  Lactic acid 1.3.   Past Medical History:  Diagnosis Date   MS (multiple sclerosis) (HCC)    Seizure (HCC)    Seizures (HCC)    Vision abnormalities    History reviewed. No pertinent surgical history.  Home Medications:  (Not in a hospital admission)  Allergies: No Known Allergies  Family History  Problem Relation Age of Onset   Healthy Mother    Dementia Father    Stroke Brother    Social History:  reports that he has been smoking cigarettes. He started smoking about 49 years ago. He has a 49.5 pack-year smoking history. He has never used smokeless tobacco. He reports current drug use. Drugs: Benzodiazepines and Opium. He reports that he does not drink alcohol.  ROS: A complete review of systems was performed.  All systems are negative except for pertinent findings as noted. Review of Systems  All other systems reviewed and are negative.    Physical Exam:  Vital signs in last 24 hours: Temp:  [98 F (36.7 C)] 98 F (36.7 C) (10/06 1855) Pulse Rate:  [78-84] 80 (10/06 2030) Resp:  [18-20] 20 (10/06 2030) BP: (149-158)/(88-102) 149/102 (10/06 2030) SpO2:  [96 %-99 %] 99 % (10/06 2030) General:  Alert and oriented, No acute distress HEENT: Normocephalic,  atraumatic Neck: No JVD or lymphadenopathy Cardiovascular: Regular rate and rhythm Lungs: Regular rate and effort Abdomen: Soft, nontender, nondistended, no abdominal masses Back: No CVA tenderness Extremities: No edema Neurologic: Grossly intact GU: He is a bit contracted with his legs together and flexed and that had to pull the scrotum up from in between his legs. Penis circumcised, normal foreskin, testicles descended bilaterally and palpably normal, scrotum severely tender to touch with sensation in all areas, but no crepitus, induration or fluctuance.  He has some superficial sloughing tissue with weepiness underneath.  He has erythema and weepiness in the thighs.  Looking back posteriorly at the perineum it is there is erythema but again no crepitus or necrosis, induration or fluctuance.  The buttocks appeared normal.   Laboratory Data:  Results for orders placed or performed during the hospital encounter of 11/18/22 (from the past 24 hour(s))  CBC with Differential     Status: Abnormal   Collection Time: 11/18/22  9:35 PM  Result Value Ref Range   WBC 10.3 4.0 - 10.5 K/uL   RBC 5.04 4.22 - 5.81 MIL/uL   Hemoglobin 14.9 13.0 - 17.0 g/dL   HCT 16.1 09.6 - 04.5 %   MCV 90.1 80.0 - 100.0 fL   MCH 29.6 26.0 - 34.0 pg   MCHC 32.8 30.0 - 36.0 g/dL   RDW 40.9 81.1 - 91.4 %   Platelets 234 150 - 400 K/uL  nRBC 0.0 0.0 - 0.2 %   Neutrophils Relative % 59 %   Neutro Abs 6.2 1.7 - 7.7 K/uL   Lymphocytes Relative 24 %   Lymphs Abs 2.5 0.7 - 4.0 K/uL   Monocytes Relative 11 %   Monocytes Absolute 1.1 (H) 0.1 - 1.0 K/uL   Eosinophils Relative 5 %   Eosinophils Absolute 0.5 0.0 - 0.5 K/uL   Basophils Relative 1 %   Basophils Absolute 0.1 0.0 - 0.1 K/uL   Immature Granulocytes 0 %   Abs Immature Granulocytes 0.03 0.00 - 0.07 K/uL  I-Stat CG4 Lactic Acid     Status: None   Collection Time: 11/18/22  9:45 PM  Result Value Ref Range   Lactic Acid, Venous 1.3 0.5 - 1.9 mmol/L  I-stat  chem 8, ED (not at [facility], [facility] or [facility])     Status: Abnormal   Collection Time: 11/18/22  9:45 PM  Result Value Ref Range   Sodium 141 135 - 145 mmol/L   Potassium 3.9 3.5 - 5.1 mmol/L   Chloride 108 98 - 111 mmol/L   BUN 20 8 - 23 mg/dL   Creatinine, Ser 1.61 0.61 - 1.24 mg/dL   Glucose, Bld 096 (H) 70 - 99 mg/dL   Calcium, Ion 0.45 4.09 - 1.40 mmol/L   TCO2 22 22 - 32 mmol/L   Hemoglobin 15.3 13.0 - 17.0 g/dL   HCT 81.1 91.4 - 78.2 %   No results found for this or any previous visit (from the past 240 hour(s)). Creatinine: Recent Labs    11/18/22 2145  CREATININE 0.90    Impression/Assessment:  Severe scrotal cellulitis with possible fungal overgrowth as well -   Plan:  He does not need scrotal debridement.  He has superficial sloughing skin.  I would recommend medical admission with continued broad-spectrum antibiotics to cover gram-positives as well as the addition of antifungal.  Will follow closely.  Jerilee Field 11/18/2022, 10:27 PM

## 2022-11-18 NOTE — ED Triage Notes (Signed)
BIBA from home for testicular pain. Pt arrived with a condom cath in place.

## 2022-11-18 NOTE — H&P (Incomplete)
History and Physical    Patient: Derek Sims ZOX:[id number] DOB: 24-Apr-1955 DOA: 11/18/2022 DOS: the patient was seen and examined on 11/19/2022 PCP: Pcp, No  Patient coming from: Home  Chief Complaint:  Chief Complaint  Patient presents with   Testicle Pain   HPI: [name] is a 67 y.o. male with medical history significant of multiple sclerosis wheelchair bound, seizure disorder who presents with testicular pain.   He noted increasing testicular pain since yesterday. No dysuria or issues with urination. No fevers of chills.  Pt has not been able to see the wounds himself. He is wheelchair bound and lives alone. Does all his ADLs himself and says he is able to bath himself. Smokes about a pack a day and denies alcohol or illicit drug use.   On arrival to ED, he was afebrile, BP of 158/88 on room air.   CBC unremarkable. Lactic within normal limits.   BMP notable for CO2 of 19 without anion gap.   CT abd/pelvis demonstrated small right hydrocele but no wall thickening or soft tissue gas.   EDP Dr. Dalene Seltzer consulted urology due to concerns for necrotizing fascitis. Dr. Mena Goes evaluated at bedside and felt no scrotal debridement needed and recommend continual coverage with broad-spectrum antibiotics to cover gram-positive and addition of antifungal.  Review of Systems: As mentioned in the history of present illness. All other systems reviewed and are negative. Past Medical History:  Diagnosis Date   MS (multiple sclerosis) (HCC)    Seizure (HCC)    Seizures (HCC)    Vision abnormalities    History reviewed. No pertinent surgical history. Social History:  reports that he has been smoking cigarettes. He started smoking about 49 years ago. He has a 49.5 pack-year smoking history. He has never used smokeless tobacco. He reports current drug use. Drugs: Benzodiazepines and Opium. He reports that he does not drink alcohol.  No Known Allergies  Family History   Problem Relation Age of Onset   Healthy Mother    Dementia Father    Stroke Brother     Prior to Admission medications   Medication Sig Start Date End Date Taking? Authorizing Provider  acetaminophen (TYLENOL) 500 MG tablet Take 500 mg by mouth.    [provider]  Incontinence Supplies (URINARY LEG BAG) MISC  01/26/14   [provider]  Interferon Beta-1b (BETASERON/EXTAVIA) 0.3 MG KIT injection Inject 0.25 mg into the skin every other day. 08/22/15   Sater, Pearletha Furl, MD  nicotine (NICODERM CQ - DOSED IN MG/24 HOURS) 21 mg/24hr patch Place 1 patch (21 mg total) onto the skin daily. 06/07/21   Leroy Sea, MD  Parenteral Therapy Supplies (CATHETER ADAPTER) MISC  06/22/14   [provider]    Physical Exam: Vitals:   11/18/22 1930 11/18/22 2030 11/18/22 2330 11/18/22 2334  BP: (!) 154/90 (!) 149/102 (!) 147/82 (!) 147/82  Pulse: 78 80 80 89  Resp: 18 20 17 20   Temp:    97.8 F (36.6 C)  TempSrc:    Oral  SpO2: 96% 99% 97% 98%   Constitutional: NAD, calm, comfortable, non-toxic appearing elderly male laying in bed. Pt occasionally moans and pain and visible contraction of the testiculars are noted. Eyes: lids and conjunctivae normal ENMT: Mucous membranes are moist.  Neck: normal, supple Respiratory: clear to auscultation bilaterally, no wheezing, no crackles. Normal respiratory effort. No accessory muscle use.  Cardiovascular: Regular rate and rhythm, no murmurs / rubs / gallops. No extremity  edema. Abdomen:soft, no tenderness Musculoskeletal: no clubbing / cyanosis. No joint deformity upper and lower extremities. Normal muscle tone.  Skin: superficial sloughing of skin to bilateral scrotum covered with purulent drainage and generalized erythema. Midline eschar. No crepitus on palpation      Neurologic: CN 2-12 grossly intact. Bilateral LE paralysis.  Psychiatric: Normal judgment and insight. Alert and oriented x 3. Normal mood.   Data  Reviewed:  See HPI  Assessment and Plan: * Cellulitis -severe cellulitis of bilateral scrotum  -urology consulted due to concerns for necrotizing fascitis. Based on CT abd/pelvis and bedside evaluation, urologist Dr. Mena Goes feels wound is superficial and recommends broad spectrum antibiotics and antifungal - continue IV vancomycin and IV Zosyn  -IV diflucan  -wound care consult placed  Tobacco dependency A pack per day use. No intention of quitting.   Hx of seizure disorder -no seizure activity for many years. Not on antiepileptic    Multiple sclerosis (HCC) -wheelchair bound at baseline. Lives alone and does his own ADLS -does not follow with neurology and not on treatment      Advance Care Planning: Full  Consults: urology  Family Communication: none at bedside  Severity of Illness: The appropriate patient status for this patient is INPATIENT. Inpatient status is judged to be reasonable and necessary in order to provide the required intensity of service to ensure the patient's safety. The patient's presenting symptoms, physical exam findings, and initial radiographic and laboratory data in the context of their chronic comorbidities is felt to place them at high risk for further clinical deterioration. Furthermore, it is not anticipated that the patient will be medically stable for discharge from the hospital within 2 midnights of admission.   * I certify that at the point of admission it is my clinical judgment that the patient will require inpatient hospital care spanning beyond 2 midnights from the point of admission due to high intensity of service, high risk for further deterioration and high frequency of surveillance required.*  Author: Anselm Jungling, DO 11/19/2022 12:18 AM  For on call review [email or web address].

## 2022-11-18 NOTE — ED Provider Notes (Signed)
     [place] EMERGENCY DEPARTMENT AT [facility] Provider Note   CSN: [id number] Arrival date & time: 11/18/22  1845     History {Add pertinent medical, surgical, social history, OB history to HPI:1} Chief Complaint  Patient presents with   Testicle Pain    Derek Sims is a 67 y.o. male.  HPI     Home Medications Prior to Admission medications   Medication Sig Start Date End Date Taking? Authorizing Provider  acetaminophen (TYLENOL) 500 MG tablet Take 500 mg by mouth.    [provider]  Incontinence Supplies (URINARY LEG BAG) MISC  01/26/14   [provider]  Interferon Beta-1b (BETASERON/EXTAVIA) 0.3 MG KIT injection Inject 0.25 mg into the skin every other day. 08/22/15   Sater, Pearletha Furl, MD  nicotine (NICODERM CQ - DOSED IN MG/24 HOURS) 21 mg/24hr patch Place 1 patch (21 mg total) onto the skin daily. 06/07/21   Leroy Sea, MD  Parenteral Therapy Supplies (CATHETER ADAPTER) MISC  06/22/14   [provider]      Allergies    Patient has no known allergies.    Review of Systems   Review of Systems  Physical Exam Updated Vital Signs BP (!) 149/102   Pulse 80   Temp 98 F (36.7 C) (Oral)   Resp 20   SpO2 99%  Physical Exam  ED Results / Procedures / Treatments   Labs (all labs ordered are listed, but only abnormal results are displayed) Labs Reviewed - No data to display  EKG None  Radiology No results found.  Procedures Procedures  {Document cardiac monitor, telemetry assessment procedure when appropriate:1}  Medications Ordered in ED Medications - No data to display  ED Course/ Medical Decision Making/ A&P   {   Click here for ABCD2, HEART and other calculatorsREFRESH Note before signing :1}                              Medical Decision Making  ***  {Document critical care time when appropriate:1} {Document review of labs and clinical decision tools ie heart score, Chads2Vasc2 etc:1}   {Document your independent review of radiology images, and any outside records:1} {Document your discussion with family members, caretakers, and with consultants:1} {Document social determinants of health affecting pt's care:1} {Document your decision making why or why not admission, treatments were needed:1} Final Clinical Impression(s) / ED Diagnoses Final diagnoses:  None    Rx / DC Orders ED Discharge Orders     None

## 2022-11-18 NOTE — ED Notes (Signed)
Patient transported to CT 

## 2022-11-19 DIAGNOSIS — G8220 Paraplegia, unspecified: Secondary | ICD-10-CM | POA: Diagnosis present

## 2022-11-19 DIAGNOSIS — E876 Hypokalemia: Secondary | ICD-10-CM | POA: Diagnosis present

## 2022-11-19 DIAGNOSIS — Z79899 Other long term (current) drug therapy: Secondary | ICD-10-CM | POA: Diagnosis not present

## 2022-11-19 DIAGNOSIS — G35 Multiple sclerosis: Secondary | ICD-10-CM | POA: Diagnosis present

## 2022-11-19 DIAGNOSIS — F17210 Nicotine dependence, cigarettes, uncomplicated: Secondary | ICD-10-CM | POA: Diagnosis present

## 2022-11-19 DIAGNOSIS — Z8669 Personal history of other diseases of the nervous system and sense organs: Secondary | ICD-10-CM

## 2022-11-19 DIAGNOSIS — Z993 Dependence on wheelchair: Secondary | ICD-10-CM | POA: Diagnosis not present

## 2022-11-19 DIAGNOSIS — F17200 Nicotine dependence, unspecified, uncomplicated: Secondary | ICD-10-CM

## 2022-11-19 DIAGNOSIS — G9341 Metabolic encephalopathy: Secondary | ICD-10-CM | POA: Diagnosis present

## 2022-11-19 DIAGNOSIS — N433 Hydrocele, unspecified: Secondary | ICD-10-CM | POA: Diagnosis present

## 2022-11-19 DIAGNOSIS — N492 Inflammatory disorders of scrotum: Secondary | ICD-10-CM | POA: Diagnosis present

## 2022-11-19 DIAGNOSIS — G3184 Mild cognitive impairment, so stated: Secondary | ICD-10-CM | POA: Diagnosis present

## 2022-11-19 DIAGNOSIS — L03818 Cellulitis of other sites: Secondary | ICD-10-CM | POA: Diagnosis not present

## 2022-11-19 LAB — BASIC METABOLIC PANEL
Anion gap: 9 (ref 5–15)
BUN: 18 mg/dL (ref 8–23)
CO2: 24 mmol/L (ref 22–32)
Calcium: 8.4 mg/dL — ABNORMAL LOW (ref 8.9–10.3)
Chloride: 105 mmol/L (ref 98–111)
Creatinine, Ser: 1.02 mg/dL (ref 0.61–1.24)
GFR, Estimated: >60 mL/min (ref >60)
Glucose, Bld: 94 mg/dL (ref 70–99)
Potassium: 3.4 mmol/L — ABNORMAL LOW (ref 3.5–5.1)
Sodium: 138 mmol/L (ref 135–145)

## 2022-11-19 MED ORDER — NICOTINE 7 MG/24HR TD PT24
7.0000 mg | MEDICATED_PATCH | Freq: Every day | TRANSDERMAL | Status: AC
  Administered 2022-11-19 – 2022-11-20 (×2): 7 mg via TRANSDERMAL
  Filled 2022-11-19 (×2): qty 1

## 2022-11-19 MED ORDER — OXYCODONE-ACETAMINOPHEN 5-325 MG PO TABS
1.0000 | ORAL_TABLET | Freq: Four times a day (QID) | ORAL | Status: DC | PRN
  Administered 2022-11-19 – 2022-11-22 (×9): 1 via ORAL
  Filled 2022-11-19 (×9): qty 1

## 2022-11-19 MED ORDER — KETOROLAC TROMETHAMINE 15 MG/ML IJ SOLN
15.0000 mg | Freq: Once | INTRAMUSCULAR | Status: AC
  Administered 2022-11-19: 15 mg via INTRAVENOUS
  Filled 2022-11-19: qty 1

## 2022-11-19 MED ORDER — ENOXAPARIN SODIUM 40 MG/0.4ML IJ SOSY
40.0000 mg | PREFILLED_SYRINGE | INTRAMUSCULAR | Status: DC
  Administered 2022-11-19 – 2022-11-23 (×5): 40 mg via SUBCUTANEOUS
  Filled 2022-11-19 (×5): qty 0.4

## 2022-11-19 MED ORDER — HYDROMORPHONE HCL 1 MG/ML IJ SOLN
0.5000 mg | INTRAMUSCULAR | Status: DC | PRN
  Administered 2022-11-19 – 2022-11-22 (×5): 0.5 mg via INTRAVENOUS
  Filled 2022-11-19 (×5): qty 0.5

## 2022-11-19 MED ORDER — ACETAMINOPHEN 325 MG PO TABS
650.0000 mg | ORAL_TABLET | Freq: Four times a day (QID) | ORAL | Status: DC | PRN
  Administered 2022-11-20: 650 mg via ORAL
  Filled 2022-11-19: qty 2

## 2022-11-19 MED ORDER — PIPERACILLIN-TAZOBACTAM 3.375 G IVPB
3.3750 g | Freq: Three times a day (TID) | INTRAVENOUS | Status: DC
  Administered 2022-11-19 – 2022-11-20 (×5): 3.375 g via INTRAVENOUS
  Filled 2022-11-19 (×5): qty 50

## 2022-11-19 MED ORDER — MORPHINE SULFATE (PF) 2 MG/ML IV SOLN
2.0000 mg | INTRAVENOUS | Status: DC | PRN
  Administered 2022-11-19 – 2022-11-21 (×4): 2 mg via INTRAVENOUS
  Filled 2022-11-19 (×4): qty 1

## 2022-11-19 MED ORDER — VANCOMYCIN HCL 1500 MG/300ML IV SOLN
1500.0000 mg | INTRAVENOUS | Status: DC
  Administered 2022-11-19: 1500 mg via INTRAVENOUS
  Filled 2022-11-19 (×2): qty 300

## 2022-11-19 MED ORDER — FLUCONAZOLE IN SODIUM CHLORIDE 200-0.9 MG/100ML-% IV SOLN
200.0000 mg | INTRAVENOUS | Status: DC
  Administered 2022-11-19 – 2022-11-20 (×2): 200 mg via INTRAVENOUS
  Filled 2022-11-19 (×2): qty 100

## 2022-11-19 MED ORDER — HYDROMORPHONE HCL 1 MG/ML IJ SOLN
1.0000 mg | Freq: Once | INTRAMUSCULAR | Status: AC
  Administered 2022-11-19: 1 mg via INTRAVENOUS
  Filled 2022-11-19: qty 1

## 2022-11-19 NOTE — Consult Note (Addendum)
WOC Nurse Consult Note: Reason for Consult: consult requested for scrotum.  Performed remotely after review of progress notes and photos in the EMR.  Urology performed assessment of the location yesterday. Scrotum with dry eschar and mod amt tan pus draining.  Red, moist and macerated areas to inner groin areas. CT scan does not indicate an abscess or necrotizing fascitis. Requested to provide topical treatment recommendations for bedside nurses to perform to promote drying and healing. Dressing procedure/placement/frequency: Apply Xeroform gauze to scrotum wounds Q day, then cover with ABD pads and hold in place with mesh underwear. Please re-consult if further assistance is needed.  Thank-you,  Cammie Mcgee MSN, RN, CWOCN, [place], CNS [phone]

## 2022-11-19 NOTE — Progress Notes (Signed)
Subjective: Long conversation with patient regarding his significant scrotal infection, concerns for future Fournier's gangrene development, his available resources and plans going forward.  All questions were answered to his satisfaction.  No acute events overnight.  He has exquisite tenderness and screams if any part of his penis or scrotum is touched.  Objective: Vital signs in last 24 hours: Temp:  [97.5 F (36.4 C)-98 F (36.7 C)] 97.7 F (36.5 C) (10/07 0300) Pulse Rate:  [77-90] 89 (10/07 0300) Resp:  [16-20] 18 (10/07 0300) BP: (136-160)/(82-102) 160/99 (10/07 0300) SpO2:  [94 %-99 %] 99 % (10/07 0300)  Assessment/Plan:  67 year old male with PMH significant for multiple sclerosis-wheelchair-bound and seizure disorder with severe superficial scrotal infection.  # Scrotal cellulitis Appears to be fungal in nature, +/- probable superimposed bacterial infection.  He remains on antifungals and broad-spectrum ABX.  Will culture discharge.  Significant sloughing with some eschar.  No necrosis on exam today.  No gas present per CT A/P  Trend daily labs  Condom catheter  Keep scrotum elevated and dry  As needed analgesics for wound care.  He has little family on the [address] and very few resources.  Will need as much help from case management as he can get.  He reports that he is able to complete his ADLs by himself but he also says that he was unaware of the severity of his infection until I showed him a picture of it.  He would benefit from an assessment by Occupational Therapy before he discharges from the hospital.  He is so tender at this point it would be impossible to get a good assessment.  He should show improvement after a few days of treatment  Will see him daily    Media Information  Document Information  Photos  Scrotal slough  11/19/2022 08:04  Attached To:  Hospital Encounter on 11/18/22  Source Information  Wyline Mood, NP  Wl-5  [facility]  Document History    Intake/Output from previous day: 10/06 0701 - 10/07 0700 In: 1242.2 [P.O.:200; IV Piggyback:1042.2] Out: 150 [Urine:150]  Intake/Output this shift: No intake/output data recorded.  Physical Exam:  General: Alert and oriented CV: No cyanosis Lungs: equal chest rise Gu: Significant scrotal sloughing and purulent drainage.  Lab Results: Recent Labs    11/18/22 2135 11/18/22 2145  HGB 14.9 15.3  HCT 45.4 45.0   BMET Recent Labs    11/18/22 2135 11/18/22 2145 11/19/22 0404  NA 139 141 138  K 3.7 3.9 3.4*  CL 108 108 105  CO2 19*  --  24  GLUCOSE 99 100* 94  BUN 19 20 18   CREATININE 0.88 0.90 1.02  CALCIUM 9.3  --  8.4*     Studies/Results: CT ABDOMEN PELVIS W CONTRAST  Result Date: 11/18/2022 CLINICAL DATA:  Brought in by ambulance for testicular pain. Query Fournier's gangrene. EXAM: CT ABDOMEN AND PELVIS WITH CONTRAST TECHNIQUE: Multidetector CT imaging of the abdomen and pelvis was performed using the standard protocol following bolus administration of intravenous contrast. RADIATION DOSE REDUCTION: This exam was performed according to the departmental dose-optimization program which includes automated exposure control, adjustment of the mA and/or kV according to patient size and/or use of iterative reconstruction technique. CONTRAST:  OMNIPAQUE IOHEXOL 300 MG/ML  SOLN COMPARISON:  CT abdomen and pelvis 06/02/2021 FINDINGS: Lower chest: No acute abnormality. Hepatobiliary: Hepatic steatosis. Cholelithiasis without evidence of cholecystitis. No biliary dilation. Pancreas: Unremarkable. Spleen: Unremarkable. Adrenals/Urinary Tract: Normal adrenal  glands. Low-attenuation lesions in the kidneys are statistically likely to represent cysts. No follow-up is required. Tiny nonobstructing left nephrolithiasis. No obstructing urinary calculi or hydronephrosis. Right anterior and posterior bladder diverticula. Stomach/Bowel: Normal caliber  large and small bowel. No bowel wall thickening. Normal appendix and stomach. Vascular/Lymphatic: Aortic atherosclerosis. No enlarged abdominal or pelvic lymph nodes. Reproductive: Unremarkable prostate. Partially visualized small right hydrocele. No visualized testicular wall thickening or soft tissue gas. Other: No free intraperitoneal fluid or air. Musculoskeletal: No acute fracture.  Right femoral head AVN. IMPRESSION: 1. Partially visualized small right hydrocele. No visualized scrotal wall thickening or soft tissue gas. 2. Cholelithiasis without evidence of cholecystitis. 3. Hepatic steatosis. 4. Right femoral head AVN. Aortic Atherosclerosis (ICD10-I70.0). Electronically Signed   By: Minerva Fester M.D.   On: 11/18/2022 23:28      LOS: 0 days   Elmon Kirschner, NP [facility] Pager: [phone]  11/19/2022, 8:48 AM

## 2022-11-19 NOTE — ED Notes (Addendum)
11:30 PM  Patient reports testicular pain progressively worsening x 2 days. Upon inspection, patient scrotum swollen and tender. Patient has pus-like, yellowish discharge coming from scrotum with spots of black necrosis. Patient reports pain 10/10 on pain scale to scrotum. He denies having fevers, chills, nausea, vomiting, CP, SOB, abdominal pain, dizziness, lightheadedness. Repeat lactic drawn and sent. Patient condom catheter reapplied and patient educated on how to use it. Patient in NAD. Call light in reach. Bed in lowest position. Pending lactic acid results and admission to hospital.   12:20 AM  Patient medicated per order. Patient in NAD. No needs voiced by patient at this time. Call light in reach. Bed in lowest position.   1:00 AM   Patient medicated per order. Patient informed of pending admission to hospital. He voices understanding. He denies any needs at this time. Call light in reach. Bed in lowest position. Plan of care ongoing.   1:45 AM  Patient sleeping with equal rise and fall of the chest wall. Patient in NAD. No needs at this time. Call light in reach. Bed in lowest position. Plan of care ongoing.   2:41 AM  Patient transported to admission bed.

## 2022-11-19 NOTE — Assessment & Plan Note (Addendum)
Wheelchair bound at baseline. Lives alone and does his own ADLS, on interferon.

## 2022-11-19 NOTE — TOC Initial Note (Signed)
Transition of Care [facility]) - Initial/Assessment Note    Patient Details  Name: Derek Sims MRN: [id number] Date of Birth: 1955/04/16  [facility] CM/SW Contact:    Harriett Sine, RN Phone Number:[phone]  11/19/2022, 1:18 PM  Clinical Narrative:                 Pt from home alone. Spoke with pt at bedside about pcp with appointment, and d/c plans. Pt is wheel chair bound, pcp and appointment scheduled with [facility] for Oct 24th at 9:45 am, TOC following .  Expected Discharge Plan: Home w Home Health Services Barriers to Discharge: Barriers Resolved   Patient Goals and CMS Choice Patient states their goals for this hospitalization and ongoing recovery are:: to stop the pain CMS Medicare.gov Compare Post Acute Care list provided to:: Patient Choice offered to / list presented to : Patient      Expected Discharge Plan and Services     Post Acute Care Choice:  (none at tihis time) Living arrangements for the past 2 months: Apartment                                      Prior Living Arrangements/Services Living arrangements for the past 2 months: Apartment Lives with:: Self   Do you feel safe going back to the place where you live?: Yes      Need for Family Participation in Patient Care: No (Comment) Care giver support system in place?: Yes (comment)   Criminal Activity/Legal Involvement Pertinent to Current Situation/Hospitalization: No - Comment as needed  Activities of Daily Living   ADL Screening (condition at time of admission) Independently performs ADLs?: Yes (appropriate for developmental age) Is the patient deaf or have difficulty hearing?: Yes Does the patient have difficulty seeing, even when wearing glasses/contacts?: Yes Does the patient have difficulty concentrating, remembering, or making decisions?: No  Permission Sought/Granted Permission sought to share information with : PCP Permission granted to share  information with : Yes, Verbal Permission Granted  Share Information with NAME: [facility]           Emotional Assessment Appearance:: Appears stated age Attitude/Demeanor/Rapport: Engaged Affect (typically observed): Accepting Orientation: : Oriented to Self, Oriented to Place, Oriented to  Time, Oriented to Situation Alcohol / Substance Use: Tobacco Use Psych Involvement: No (comment)  Admission diagnosis:  Cellulitis [L03.90] Patient Active Problem List   Diagnosis Date Noted   Hx of seizure disorder 11/19/2022   Tobacco dependency 11/19/2022   Scrotal pain 11/18/2022   Cellulitis 11/18/2022   Multiple sclerosis (HCC) 06/04/2021   AMS (altered mental status) 06/02/2021   Pressure injury of skin 06/02/2021   Sepsis with encephalopathy without septic shock (HCC)    AKI (acute kidney injury) (HCC)    Multiple sclerosis (HCC) 07/21/2015   Gait disturbance 07/21/2015   Wheelchair dependence 07/21/2015   Urinary retention 07/21/2015   Visual disturbance 07/21/2015   PCP:  Oneita Hurt, No Pharmacy:   [facility] [place], [place] - [address] [facility] [address] [place] [address] Phone: [phone] Fax: [phone]  [facility] [phone] - [place], [place] - [address] [place] [address] Phone: [phone] Fax: [phone]  [facility] [address], [place] - [address] [place] [address] Phone: [phone]  Fax: [phone]  [facility] [place], [place] - [address] [place] [address] Phone: [phone] Fax: [phone]  [facility] [address] [place], [place] - [address] [place] [address] Phone: [phone] Fax: [phone]  [facility] [address] [place] [address] Phone: [phone] Fax:  [phone]     Social Determinants of Health (SDOH) Social History: SDOH Screenings   Food Insecurity: No Food Insecurity (11/19/2022)  Housing: Low Risk  (11/19/2022)  Transportation Needs: No Transportation Needs (11/19/2022)  Utilities: Not At Risk (11/19/2022)  Tobacco Use: High Risk (11/18/2022)   SDOH Interventions:     Readmission Risk Interventions     No data to display

## 2022-11-19 NOTE — Progress Notes (Addendum)
Patient alert and oriented x 4, arrived from er via stretcher, vss . Patient c/o scrotal pain, rates 10/10 on pain scale, morphine 2 mg iv administered for pain. Scrotum is red, swollen and tender to touch with brown slough  ;draining large amounts of yellow purulent drainage, foul odor. Patient also has erythema to groin and perineum with swelling. Patient verbalizes being independent at home with adl's states "I take my own shower" "Everything is within my reach". Patient also states " I am wheelchair bound I am unable to move my legs". On admission patient had lighter and 1 pack of cigarettes. Cigarettes were left at the bedside in a patient bag , lighter was placed on chart until pt is discharged. Patient was oriented to unit and call bell system, was encouraged to call if need any type of assistance. Patient verbalizes an understanding. Bed is in lowest position, side rails up x 3, call bell is within reach, bed alarm on.

## 2022-11-19 NOTE — Progress Notes (Signed)
  Progress Note   Patient: Derek Sims JYN:[id number] DOB: 23-Nov-1955 DOA: 11/18/2022     0 DOS: the patient was seen and examined on 11/19/2022 at 9:07AM      Brief hospital course: 67 y.o. male with medical history significant of multiple sclerosis wheelchair bound, seizure disorder who presented with scrotal pain.  Found to have scrotal infection, CT uremarkable. Not clinically deep tissue gangrene.  Evaluated by Urology.     Assessment and Plan: * Scrotal cellulitis - Continue diflucan and antibiotics - Consult Urology - Follow cultures  Hypokalemia    Tobacco dependency    Hx of seizure disorder No seizure activity for many years. Not on antiepileptic    Multiple sclerosis (HCC) Wheelchair bound at baseline. Lives alone and does his own ADLS, on interferon.          Subjective: Patient with exquisite pain of scrotum.       Physical Exam: BP (!) 150/89   Pulse 85   Temp 97.7 F (36.5 C) (Oral)   Resp (!) 22   SpO2 98%   Thin adult male, lying in bed, stigmata of chronic MS RRR no murmurs, no LE edema RR normal, lungs clear Attention normal, affect confused, pinpoint pupils, appears forgetful, weakness in all 4, some spasticity/increased tone     Data Reviewed: Patient metabolic panel shows potassium 3.4, creatinine normal CBC normal  Family Communication: None present    Disposition: Status is: Inpatient         Author: Alberteen Sam, MD 11/19/2022 5:17 PM  For on call review [email or web address].

## 2022-11-19 NOTE — Assessment & Plan Note (Addendum)
-   Continue diflucan and antibiotics - Consult Urology - Follow cultures

## 2022-11-19 NOTE — Assessment & Plan Note (Deleted)
-  severe cellulitis of bilateral scrotum  -urology consulted due to concerns for necrotizing fascitis. Based on CT abd/pelvis and bedside evaluation, urologist Dr. Mena Goes feels wound is superficial and recommends broad spectrum antibiotics and antifungal - continue IV vancomycin and IV Zosyn  -IV diflucan  -wound care consult placed

## 2022-11-19 NOTE — Hospital Course (Signed)
67 year old man PMH multiple sclerosis, wheelchair bound, lives independently presented with scrotal fungal infection and bacterial superinfection.  Seen by urology.  Plan for SNF.  Consultants Urology   Procedures None

## 2022-11-19 NOTE — Assessment & Plan Note (Addendum)
No seizure activity for many years. Not on antiepileptic

## 2022-11-19 NOTE — Progress Notes (Signed)
Pharmacy Antibiotic Note  CECILIA NISHIKAWA is a 67 y.o. male admitted on 11/18/2022 with medical history significant of multiple sclerosis wheelchair bound, seizure disorder who presents with testicular pain .  Pharmacy has been consulted to dose vancomycin, zosyn and diflucan.  Plan: Vancomycin 1gm IV x 1 then 1gm q12h (AUC 490.6, Scr 0.9, wt 75kg) Zosyn 3.375g IV Q8H infused over 4hrs. Fluconazole 200mg  IV q24h Follow renal function, cultures and clinical course     Temp (24hrs), Avg:97.9 F (36.6 C), Min:97.8 F (36.6 C), Max:98 F (36.7 C)  Recent Labs  Lab 11/18/22 2135 11/18/22 2145 11/18/22 2341  WBC 10.3  --   --   CREATININE 0.88 0.90  --   LATICACIDVEN  --  1.3 1.7    CrCl cannot be calculated (Unknown ideal weight.).    No Known Allergies  Antimicrobials this admission: 10/6 vanc >> 10/6 clinda x 1 10/6 zosyn >> 10/7 fluconazole >>  Dose adjustments this admission:   Microbiology results: 10/6 BCx:   Thank you for allowing pharmacy to be a part of this patient's care.  Arley Phenix RPh 11/19/2022, 12:27 AM

## 2022-11-20 DIAGNOSIS — G9341 Metabolic encephalopathy: Secondary | ICD-10-CM | POA: Insufficient documentation

## 2022-11-20 DIAGNOSIS — G35 Multiple sclerosis: Secondary | ICD-10-CM | POA: Diagnosis not present

## 2022-11-20 DIAGNOSIS — L03818 Cellulitis of other sites: Secondary | ICD-10-CM | POA: Diagnosis not present

## 2022-11-20 DIAGNOSIS — Z8669 Personal history of other diseases of the nervous system and sense organs: Secondary | ICD-10-CM | POA: Diagnosis not present

## 2022-11-20 DIAGNOSIS — E876 Hypokalemia: Secondary | ICD-10-CM | POA: Diagnosis not present

## 2022-11-20 LAB — CREATININE, SERUM
Creatinine, Ser: 1.03 mg/dL (ref 0.61–1.24)
GFR, Estimated: >60 mL/min (ref >60)

## 2022-11-20 MED ORDER — SODIUM CHLORIDE 0.9 % IV SOLN
1.0000 g | INTRAVENOUS | Status: DC
  Administered 2022-11-20: 1 g via INTRAVENOUS
  Filled 2022-11-20: qty 10

## 2022-11-20 NOTE — Evaluation (Signed)
Physical Therapy Evaluation Patient Details Name: Derek Sims MRN: [id number] DOB: Oct 23, 1955 Today's Date: 11/20/2022  History of Present Illness  67 year old male with PMH significant for multiple sclerosis-wheelchair-bound and seizure disorder with severe superficial scrotal infection.  Clinical Impression  Pt admitted with above diagnosis.  Pt currently with functional limitations due to the deficits listed below (see PT Problem List). Pt will benefit from acute skilled PT to increase their independence and safety with mobility to allow discharge.     The patient is very anxious, reporting significant scrotal pain. Patient required much assistance and encouragement to mobilize to sitting on bed edge. Use of bed bad to assist patient to move to bed edge and back to supine.  Patient currently not able to tolerate mobility to assess transfers to a WC.  Patient is independent with transfers to [facility] PTA and  basic ADL's, has support of family PRN. Patient will benefit from continued inpatient follow up therapy, <3 hours/day unless patient able to progress to mod independent transfers to return home.     If plan is discharge home, recommend the following: Two people to help with walking and/or transfers;A lot of help with bathing/dressing/bathroom;Assistance with cooking/housework;Assist for transportation   Can travel by private vehicle   No    Equipment Recommendations None recommended by PT  Recommendations for Other Services       Functional Status Assessment Patient has had a recent decline in their functional status and demonstrates the ability to make significant improvements in function in a reasonable and predictable amount of time.     Precautions / Restrictions Precautions Precautions: Fall;Other (comment) Precaution Comments: scrotal edema Restrictions Weight Bearing Restrictions: No      Mobility  Bed Mobility Overal bed mobility: Needs Assistance Bed  Mobility: Sit to Sidelying, Sit to Supine, Rolling Rolling: Max assist, +2 for physical assistance, Used rails     Sit to supine: Total assist, +2 for physical assistance, +2 for safety/equipment, Used rails, HOB elevated Sit to sidelying: Total assist, +2 for physical assistance General bed mobility comments: Limited by pain level with any movement. Increased time taken to transition to sitting on EOB d/t distraction level and difficulty attending to task. Multimodel cues provided for sequencing and technique to complete bed mobility. Pt frequently unknownly pushing against movement due to fear of falling.    Transfers                        Ambulation/Gait                  Stairs            Wheelchair Mobility     Tilt Bed    Modified Rankin (Stroke Patients Only)       Balance Overall balance assessment: History of Falls, Needs assistance Sitting-balance support: Bilateral upper extremity supported, Feet unsupported Sitting balance-Leahy Scale: Poor Sitting balance - Comments: while seated on EOB, pt was able to intermittently maintain static sitting balance when he had recliner arm rest to hold onto. Occasional LOB demonstrated towards right and posteriorly.                                     Pertinent Vitals/Pain Pain Assessment Pain Assessment: Faces Faces Pain Scale: Hurts worst Pain Location: scrotum with any movement or bed mobility Pain Descriptors / Indicators: Discomfort, Moaning, Guarding, Grimacing, Other (Comment)  Pain Intervention(s): Limited activity within patient's tolerance, Premedicated before session, Repositioned, Monitored during session    Home Living Family/patient expects to be discharged to:: Private residence Living Arrangements: Alone Available Help at Discharge: Available PRN/intermittently;Family (Brother in Social worker, sister) Type of Home: Apartment Home Access: Level entry       Home Layout: One  level Home Equipment: Wheelchair - manual;Grab bars - toilet;Grab bars - tub/shower Additional Comments: brother in law, sister.    Prior Function Prior Level of Function : Independent/Modified Independent             Mobility Comments: independent  with WC transfers, Non-ambulatory ADLs Comments: rolls WC into shower and bathes while seated in WC. performed dressing while seated in WC. Wears a condom cath daily for bladder management. Continent of bowels at baseline.     Extremity/Trunk Assessment   Upper Extremity Assessment Upper Extremity Assessment: Defer to OT evaluation RUE Deficits / Details: demonstrates functional initial strength although decreased muscular endurance. Decreased motor control and sequencing. LUE Deficits / Details: demonstrates functional initial strength although decreased muscular endurance. Decreased motor control and sequencing.    Lower Extremity Assessment Lower Extremity Assessment: RLE deficits/detail;LLE deficits/detail RLE Deficits / Details: patient moves legs using  UE's , NT specific due to patient cancentration on task and  reporting pain RLE: Unable to fully assess due to pain LLE Deficits / Details: similrr to  right LLE: Unable to fully assess due to pain    Cervical / Trunk Assessment Cervical / Trunk Assessment: Other exceptions Cervical / Trunk Exceptions: rounded shoulders, forward head, posterior pelvic tilt  Communication   Communication Communication: Difficulty following commands/understanding Following commands: Follows one step commands inconsistently;Follows one step commands with increased time Cueing Techniques: Gestural cues;Tactile cues  Cognition Arousal: Alert Behavior During Therapy: Anxious Overall Cognitive Status: No family/caregiver present to determine baseline cognitive functioning Area of Impairment: Memory, Orientation, Attention, Following commands, Safety/judgement, Awareness, Problem solving                  Orientation Level: Disoriented to, Situation Current Attention Level: Focused Memory: Decreased short-term memory Following Commands: Follows one step commands inconsistently, Follows one step commands with increased time Safety/Judgement: Decreased awareness of safety, Decreased awareness of deficits Awareness: Intellectual Problem Solving: Slow processing, Decreased initiation, Difficulty sequencing, Requires verbal cues, Requires tactile cues General Comments: Self limiting by pain and required frequent cues to stay on task of therapy, stating he cannot do the things therapy asking as far a placing  socks, using UE for support to scoot, talking non stop        General Comments General comments (skin integrity, edema, etc.): Condom cath had fallen off upon therapy arrival. Scrotal edema present.    Exercises     Assessment/Plan    PT Assessment Patient needs continued PT services  PT Problem List Decreased strength;Decreased activity tolerance;Decreased mobility;Decreased cognition;Decreased range of motion;Decreased balance;Impaired sensation;Decreased skin integrity;Pain       PT Treatment Interventions DME instruction;Functional mobility training;Balance training;Patient/family education;Therapeutic activities;Wheelchair mobility training;Therapeutic exercise    PT Goals (Current goals can be found in the Care Plan section)  Acute Rehab PT Goals Patient Stated Goal: go home, no pain PT Goal Formulation: With patient Time For Goal Achievement: 12/04/22 Potential to Achieve Goals: Fair    Frequency Min 1X/week     Co-evaluation   Reason for Co-Treatment: Complexity of the patient's impairments (multi-system involvement);For patient/therapist safety;To address functional/ADL transfers   OT goals addressed during session: ADL's and  self-care;Strengthening/ROM       AM-PAC PT "6 Clicks" Mobility  Outcome Measure Help needed turning from your back to your  side while in a flat bed without using bedrails?: Total Help needed moving from lying on your back to sitting on the side of a flat bed without using bedrails?: Total Help needed moving to and from a bed to a chair (including a wheelchair)?: Total Help needed standing up from a chair using your arms (e.g., wheelchair or bedside chair)?: Total Help needed to walk in hospital room?: Total Help needed climbing 3-5 steps with a railing? : Total 6 Click Score: 6    End of Session   Activity Tolerance: Patient limited by pain Patient left: in bed;with call bell/phone within reach;with bed alarm set Nurse Communication: Mobility status PT Visit Diagnosis: Unsteadiness on feet (R26.81);History of falling (Z91.81);Other symptoms and signs involving the nervous system (R29.898)    Time: 3557-3220 PT Time Calculation (min) (ACUTE ONLY): 38 min   Charges:   PT Evaluation $PT Eval Low Complexity: 1 Low   PT General Charges $$ ACUTE PT VISIT: 1 Visit         Blanchard Kelch PT Acute Rehabilitation Services Office [phone] Weekend pager-[phone]   Rada Hay 11/20/2022, 1:43 PM

## 2022-11-20 NOTE — Evaluation (Signed)
Occupational Therapy Evaluation Patient Details Name: Derek Sims MRN: [id number] DOB: Jun 18, 1955 Today's Date: 11/20/2022   History of Present Illness 67 year old male with PMH significant for multiple sclerosis-wheelchair-bound and seizure disorder with severe superficial scrotal infection.   Clinical Impression   Pt admitted with the above diagnosis. Pt currently with functional limitations due to the deficits listed below (see OT Problem List). Prior to admit, pt reports that he lived alone in an apartment completing ADL tasks and functional transfers at Mod I level. Pt is WC bound at baseline. See below for more details regarding functional performance prior to admit.  Pt will benefit from acute skilled OT to increase their safety and independence with ADL and functional mobility for ADL to facilitate discharge. Patient will benefit from continued inpatient follow up therapy, <3 hours/day. OT will continue to follow patient acutely.          If plan is discharge home, recommend the following: Two people to help with walking and/or transfers;Two people to help with bathing/dressing/bathroom;Assistance with cooking/housework;Assistance with feeding;Direct supervision/assist for medications management;Direct supervision/assist for financial management;Assist for transportation;Help with stairs or ramp for entrance;Supervision due to cognitive status    Functional Status Assessment  Patient has had a recent decline in their functional status and demonstrates the ability to make significant improvements in function in a reasonable and predictable amount of time.  Equipment Recommendations  Other (comment) (defer to next venue of care)       Precautions / Restrictions Precautions Precautions: Fall;Other (comment) Precaution Comments: scrotal edema Restrictions Weight Bearing Restrictions: No      Mobility Bed Mobility Overal bed mobility: Needs Assistance Bed Mobility: Sit  to Sidelying, Sit to Supine, Rolling Rolling: Max assist, +2 for physical assistance, Used rails     Sit to supine: Total assist, +2 for physical assistance, +2 for safety/equipment, Used rails, HOB elevated Sit to sidelying: Total assist, +2 for physical assistance General bed mobility comments: Limited by pain level with any movement. Increased time taken to transition to sitting on EOB d/t distraction level and difficulty attending to task. Multimodel cues provided for sequencing and technique to complete bed mobility. Pt frequently unknownly pushing against movement due to fear of falling.    Transfers    General transfer comment: Unable to attempt transfer during eval secondary to pain level.      Balance Overall balance assessment: History of Falls, Needs assistance Sitting-balance support: Bilateral upper extremity supported, Feet unsupported Sitting balance-Leahy Scale: Poor Sitting balance - Comments: while seated on EOB, pt was able to intermittently maintain static sitting balance when he had recliner arm rest to hold onto. Occasional LOB demonstrated towards right and posteriorly. Postural control: Posterior lean, Right lateral lean Standing balance support:  (NT)     ADL either performed or assessed with clinical judgement   ADL Overall ADL's : Needs assistance/impaired    General ADL Comments: At this time, pt requires total AX2 either at bed level or sitting for bathing and dressing. Total assist for toileting using condom catheter.     Vision Baseline Vision/History: 1 Wears glasses Ability to See in Adequate Light: 2 Moderately impaired Patient Visual Report: No change from baseline Vision Assessment?:  (unable to assess vision during evaluation)     Perception Perception: Impaired Preception Impairment Details: Figure ground, Topographical orientation, Spatial orientation     Praxis Praxis: Impaired Praxis Impairment Details: Initiation, Motor planning,  Organization     Pertinent Vitals/Pain Pain Assessment Pain Assessment: Faces Faces Pain  Scale: Hurts worst Pain Location: scrotum with any movement or bed mobility Pain Descriptors / Indicators: Discomfort, Moaning, Guarding, Grimacing, Other (Comment) (yelling out) Pain Intervention(s): Limited activity within patient's tolerance, Repositioned, Premedicated before session, Monitored during session     Extremity/Trunk Assessment Upper Extremity Assessment Upper Extremity Assessment: RUE deficits/detail;LUE deficits/detail RUE Deficits / Details: demonstrates functional initial strength although decreased muscular endurance. Decreased motor control and sequencing. LUE Deficits / Details: demonstrates functional initial strength although decreased muscular endurance. Decreased motor control and sequencing.   Lower Extremity Assessment Lower Extremity Assessment: Defer to PT evaluation;Generalized weakness   Cervical / Trunk Assessment Cervical / Trunk Assessment: Other exceptions Cervical / Trunk Exceptions: rounded shoulders, forward head, posterior pelvic tilt   Communication Communication Communication: Difficulty following commands/understanding Following commands: Follows one step commands inconsistently;Follows one step commands with increased time Cueing Techniques: Verbal cues;Gestural cues;Tactile cues;Visual cues   Cognition Arousal: Alert Behavior During Therapy: Anxious (opinionated) Overall Cognitive Status: No family/caregiver present to determine baseline cognitive functioning Area of Impairment: Memory, Orientation, Attention, Following commands, Safety/judgement, Awareness, Problem solving                 Orientation Level: Disoriented to, Situation Current Attention Level: Focused Memory: Decreased short-term memory Following Commands: Follows one step commands inconsistently, Follows one step commands with increased time Safety/Judgement: Decreased  awareness of safety, Decreased awareness of deficits Awareness: Intellectual Problem Solving: Slow processing, Decreased initiation, Difficulty sequencing, Requires verbal cues, Requires tactile cues General Comments: Self limiting by pain and very opinionated and narrow minded around what he can and can't do and how he does it.     General Comments  Condom cath had fallen off upon therapy arrival. Scrotal edema present.            Home Living Family/patient expects to be discharged to:: Private residence Living Arrangements: Alone Available Help at Discharge: Available PRN/intermittently;Family (Brother in Social worker, sister) Type of Home: Apartment Home Access: Level entry     Home Layout: One level     Bathroom Shower/Tub: Walk-in shower;Other (comment) (Roll in shower)   Bathroom Toilet: Standard Bathroom Accessibility: Yes How Accessible: Accessible via wheelchair Home Equipment: Wheelchair - manual;Grab bars - toilet;Grab bars - tub/shower   Additional Comments: brother in law, sister.      Prior Functioning/Environment Prior Level of Function : Independent/Modified Independent    Mobility Comments: independent  with WC transfers, Non-ambulatory ADLs Comments: rolls WC into shower and bathes while seated in WC. performed dressing while seated in WC. Wears a condom cath daily for bladder management. Continent of bowels at baseline.        OT Problem List: Decreased activity tolerance;Impaired balance (sitting and/or standing);Impaired vision/perception;Decreased coordination;Decreased cognition;Decreased knowledge of use of DME or AE;Decreased safety awareness;Pain;Increased edema;Decreased strength      OT Treatment/Interventions: Self-care/ADL training;Therapeutic exercise;Energy conservation;DME and/or AE instruction;Manual therapy;Modalities;Therapeutic activities;Cognitive remediation/compensation;Visual/perceptual remediation/compensation;Patient/family  education;Balance training    OT Goals(Current goals can be found in the care plan section) Acute Rehab OT Goals Patient Stated Goal: to get better and return home OT Goal Formulation: Patient unable to participate in goal setting Time For Goal Achievement: 12/04/22 Potential to Achieve Goals: Fair  OT Frequency: Min 1X/week    Co-evaluation PT/OT/SLP Co-Evaluation/Treatment: Yes Reason for Co-Treatment: Complexity of the patient's impairments (multi-system involvement);For patient/therapist safety;To address functional/ADL transfers   OT goals addressed during session: ADL's and self-care;Strengthening/ROM      AM-PAC OT "6 Clicks" Daily Activity     Outcome Measure Help from  another person eating meals?: A Lot Help from another person taking care of personal grooming?: A Lot Help from another person toileting, which includes using toliet, bedpan, or urinal?: Total Help from another person bathing (including washing, rinsing, drying)?: Total Help from another person to put on and taking off regular upper body clothing?: A Lot Help from another person to put on and taking off regular lower body clothing?: Total 6 Click Score: 9   End of Session    Activity Tolerance: Patient limited by pain Patient left: in bed;with call bell/phone within reach;with bed alarm set  OT Visit Diagnosis: Repeated falls (R29.6);Muscle weakness (generalized) (M62.81);History of falling (Z91.81);Ataxia, unspecified (R27.0);Other symptoms and signs involving the nervous system (R29.898);Pain Pain - Right/Left:  (N/A) Pain - part of body:  (Scrotum)                Time: 1610-9604 OT Time Calculation (min): 28 min Charges:  OT General Charges $OT Visit: 1 Visit OT Evaluation $OT Eval Moderate Complexity: 1 Mod  [employer], OTR/L,CBIS  Supplemental OT - [facility] and [facility] Secure Chat Preferred    Nial Hawe, Charisse March 11/20/2022, 1:15 PM

## 2022-11-20 NOTE — Progress Notes (Signed)
Pharmacy Antibiotic Note  Derek Sims is a 67 y.o. male admitted on 11/18/2022 with  scrotal  infection . PMH significant for MS - wheelchair bound at baseline. Pharmacy has been consulted for piperacillin/tazobactam, fluconazole, and vancomycin dosing.  Today, 11/20/22 WBC WNL SCr stable, WNL  Updated weight needed. Ht/wt order entered 10/7, and again on 10/8. Currently using last documented weight of 75 mg from April 2023 for dosing.   Plan: Piperacillin/tazobactam 3.375 g IV extended infusion q8h Fluconazole 200 mg IV q24h Vancomycin 1500 mg IV q24h for estimated AUC of 406. Goal AUC 400-550. Check levels at steady state as needed.   Follow renal function, culture data.     Temp (24hrs), Avg:98.2 F (36.8 C), Min:97.7 F (36.5 C), Max:98.5 F (36.9 C)  Recent Labs  Lab 11/18/22 2135 11/18/22 2145 11/18/22 2341 11/19/22 0404 11/20/22 0419  WBC 10.3  --   --   --   --   CREATININE 0.88 0.90  --  1.02 1.03  LATICACIDVEN  --  1.3 1.7  --   --     CrCl cannot be calculated (Unknown ideal weight.).    No Known Allergies  Antimicrobials this admission: Piperacillin/tazobactam 10/6 >>  vancomycin 10/6 >>  Fluconazole 10/6 >>  Dose adjustments this admission:  Microbiology results: 10/6 BCx: ngtd  Cindi Carbon, PharmD 11/20/2022 7:55 AM

## 2022-11-20 NOTE — Assessment & Plan Note (Signed)
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  Here exacerbated by opiates. - Standard delirium precautions: blinds open and lights on during day, TV off, minimize interruptions at night, glasses/hearing aids, PT/OT, avoiding [place] list medications

## 2022-11-20 NOTE — Progress Notes (Signed)
Subjective: Patient still complaining of extreme pain despite the fact that he was getting all available narcotics last night.  He is sleeping on my arrival but easily awoken.  No acute events  Objective: Vital signs in last 24 hours: Temp:  [97.7 F (36.5 C)-98.5 F (36.9 C)] 98.5 F (36.9 C) (10/08 0423) Pulse Rate:  [81-87] 81 (10/08 0423) Resp:  [14-22] 18 (10/08 0423) BP: (143-181)/(87-95) 143/95 (10/08 0423) SpO2:  [98 %-100 %] 100 % (10/08 0423)  Assessment/Plan:  67 year old male with PMH significant for multiple sclerosis-wheelchair-bound and seizure disorder with severe superficial scrotal infection.  # Scrotal cellulitis Appears to be fungal in nature, +/- probable superimposed bacterial infection.  He remains on antifungals and broad-spectrum ABX.  Reordered culture.  Xeroform gauze not completely covering area of concern due to patient resistance.  That being said, it is still softened most of the superficial eschar which has sloughed off at this point,  making the wound look much better.  Malodor has resolved and discharge is diminishing.  No gas present in scrotum per CT A/P  Trend daily labs  Condom catheter  Keep scrotum elevated and dry  As needed analgesics for wound care.  He has little family on the [address] and very few resources.  Will need as much help from case management as he can get.  He reports that he is able to complete his ADLs by himself but he also says that he was unaware of the severity of his infection until I showed him a picture of it.  He would benefit from an assessment by Occupational Therapy before he discharges from the hospital.  Case mgmt: pt is not completing ADL's. That is how ended up in the hospital. Will need this assistance and would care depending on discharge date.   Will see him daily     Intake/Output from previous day: 10/07 0701 - 10/08 0700 In: 100 [IV Piggyback:100] Out: 150 [Urine:150]  Intake/Output  this shift: No intake/output data recorded.  Physical Exam:  General: Alert and oriented CV: No cyanosis Lungs: equal chest rise Gu: Significant scrotal sloughing and purulent drainage. Improved today with less eschar.   Lab Results: Recent Labs    11/18/22 2135 11/18/22 2145  HGB 14.9 15.3  HCT 45.4 45.0   BMET Recent Labs    11/18/22 2135 11/18/22 2145 11/19/22 0404 11/20/22 0419  NA 139 141 138  --   K 3.7 3.9 3.4*  --   CL 108 108 105  --   CO2 19*  --  24  --   GLUCOSE 99 100* 94  --   BUN 19 20 18   --   CREATININE 0.88 0.90 1.02 1.03  CALCIUM 9.3  --  8.4*  --      Studies/Results: CT ABDOMEN PELVIS W CONTRAST  Result Date: 11/18/2022 CLINICAL DATA:  Brought in by ambulance for testicular pain. Query Fournier's gangrene. EXAM: CT ABDOMEN AND PELVIS WITH CONTRAST TECHNIQUE: Multidetector CT imaging of the abdomen and pelvis was performed using the standard protocol following bolus administration of intravenous contrast. RADIATION DOSE REDUCTION: This exam was performed according to the departmental dose-optimization program which includes automated exposure control, adjustment of the mA and/or kV according to patient size and/or use of iterative reconstruction technique. CONTRAST:  OMNIPAQUE IOHEXOL 300 MG/ML  SOLN COMPARISON:  CT abdomen and pelvis 06/02/2021 FINDINGS: Lower chest: No acute abnormality. Hepatobiliary: Hepatic steatosis. Cholelithiasis without evidence of cholecystitis. No biliary dilation. Pancreas:  Unremarkable. Spleen: Unremarkable. Adrenals/Urinary Tract: Normal adrenal glands. Low-attenuation lesions in the kidneys are statistically likely to represent cysts. No follow-up is required. Tiny nonobstructing left nephrolithiasis. No obstructing urinary calculi or hydronephrosis. Right anterior and posterior bladder diverticula. Stomach/Bowel: Normal caliber large and small bowel. No bowel wall thickening. Normal appendix and stomach.  Vascular/Lymphatic: Aortic atherosclerosis. No enlarged abdominal or pelvic lymph nodes. Reproductive: Unremarkable prostate. Partially visualized small right hydrocele. No visualized testicular wall thickening or soft tissue gas. Other: No free intraperitoneal fluid or air. Musculoskeletal: No acute fracture.  Right femoral head AVN. IMPRESSION: 1. Partially visualized small right hydrocele. No visualized scrotal wall thickening or soft tissue gas. 2. Cholelithiasis without evidence of cholecystitis. 3. Hepatic steatosis. 4. Right femoral head AVN. Aortic Atherosclerosis (ICD10-I70.0). Electronically Signed   By: Minerva Fester M.D.   On: 11/18/2022 23:28      LOS: 1 day   Elmon Kirschner, NP [facility] Pager: [phone]  11/20/2022, 8:14 AM

## 2022-11-20 NOTE — Progress Notes (Signed)
Epic chat sent to hospitalist Danford & CM Lewis. sister just called asking if patient qualifies for short term rehab, I see plan with case management is home health. No family is present to assist patient.

## 2022-11-20 NOTE — TOC Progression Note (Signed)
Transition of Care [facility]) - Progression Note    Patient Details  Name: Derek Sims MRN: [id number] Date of Birth: [birth year]  [facility]) CM/SW Contact  Amada Jupiter, LCSW Phone Number: 11/20/2022, 2:30 PM  Clinical Narrative:     Met with pt and spoke with sister, Derek Sims, to review therapy recommendations for SNF rehab.  Have explained that pt not currently at his mod ind w/c level baseline in addition to nursing care needed for wounds/ abx.  They are agreed that he is not functioning well enough to return home alone and are both agreed to plan for SNF.  Will begin SNF bed search.    Expected Discharge Plan: Home w Home Health Services Barriers to Discharge: Barriers Resolved  Expected Discharge Plan and Services     Post Acute Care Choice:  (none at tihis time) Living arrangements for the past 2 months: Apartment                                       Social Determinants of Health (SDOH) Interventions SDOH Screenings   Food Insecurity: No Food Insecurity (11/19/2022)  Housing: Low Risk  (11/19/2022)  Transportation Needs: No Transportation Needs (11/19/2022)  Utilities: Not At Risk (11/19/2022)  Tobacco Use: High Risk (11/18/2022)    Readmission Risk Interventions    11/20/2022    2:30 PM  Readmission Risk Prevention Plan  Post Dischage Appt Complete  Medication Screening Complete  Transportation Screening Complete

## 2022-11-20 NOTE — NC FL2 (Signed)
[place] MEDICAID FL2 LEVEL OF CARE FORM     IDENTIFICATION  Patient Name: Derek Sims Birthdate: 11-16-1955 Sex: male Admission Date (Current Location): 11/18/2022  [place] and [place] Number:  Producer, television/film/video and Address:  [facility],  [address] [place], [place] [address]      Provider Number: [id number]  Attending Physician Name and Address:  Alberteen Sam, *  Relative Name and Phone Number:  sister, Josefine Class @ [phone]    Current Level of Care: Hospital Recommended Level of Care: Skilled Nursing Facility Prior Approval Number:    Date Approved/Denied:   PASRR Number: [id number]  Discharge Plan: SNF    Current Diagnoses: Patient Active Problem List   Diagnosis Date Noted   Hx of seizure disorder 11/19/2022   Tobacco dependency 11/19/2022   Hypokalemia 11/19/2022   Scrotal cellulitis 11/18/2022   Multiple sclerosis (HCC) 06/04/2021   AMS (altered mental status) 06/02/2021   Pressure injury of skin 06/02/2021   Sepsis with encephalopathy without septic shock (HCC)    AKI (acute kidney injury) (HCC)    Multiple sclerosis (HCC) 07/21/2015   Gait disturbance 07/21/2015   Wheelchair dependence 07/21/2015   Urinary retention 07/21/2015   Visual disturbance 07/21/2015    Orientation RESPIRATION BLADDER Height & Weight     Self, Time, Situation, Place  Normal Incontinent, External catheter (currently with condon catheter) Weight:   Height:  5' 11.5" (181.6 cm)  BEHAVIORAL SYMPTOMS/MOOD NEUROLOGICAL BOWEL NUTRITION STATUS      Continent Diet (regular)  AMBULATORY STATUS COMMUNICATION OF NEEDS Skin   Total Care Verbally Other (Comment) (Scrotum swelling and redness to scrotum, groin, perineum, and bilateral rt and lt thighs, necrotic tissue; Apply Xeroform gauze to scrotum wounds Q day, then cover with ABD pads and hold in place with mesh underwear.)                       Personal Care Assistance Level of  Assistance  Bathing, Dressing Bathing Assistance: Limited assistance   Dressing Assistance: Limited assistance     Functional Limitations Info  Sight, Hearing, Speech Sight Info: Adequate Hearing Info: Adequate Speech Info: Adequate    SPECIAL CARE FACTORS FREQUENCY  PT (By licensed PT), OT (By licensed OT)     PT Frequency: 5x/wk OT Frequency: 5x/wk            Contractures Contractures Info: Not present    Additional Factors Info  Code Status, Allergies Code Status Info: Full Allergies Info: NKDA           Current Medications (11/20/2022):  This is the current hospital active medication list Current Facility-Administered Medications  Medication Dose Route Frequency Provider Last Rate Last Admin   acetaminophen (TYLENOL) tablet 650 mg  650 mg Oral Q6H PRN Tu, Ching T, DO   650 mg at 11/20/22 0008   enoxaparin (LOVENOX) injection 40 mg  40 mg Subcutaneous Q24H Tu, Ching T, DO   40 mg at 11/20/22 0930   fluconazole (DIFLUCAN) IVPB 200 mg  200 mg Intravenous Q24H Maurice March, [facility]   Stopped at 11/19/22 2229   HYDROmorphone (DILAUDID) injection 0.5 mg  0.5 mg Intravenous Q3H PRN Wyline Mood, NP   0.5 mg at 11/20/22 0614   morphine (PF) 2 MG/ML injection 2 mg  2 mg Intravenous Q3H PRN Tu, Ching T, DO   2 mg at 11/20/22 0417   nicotine (NICODERM CQ - dosed in mg/24 hr) patch 7  mg  7 mg Transdermal Daily Luiz Iron, NP   7 mg at 11/20/22 0931   oxyCODONE-acetaminophen (PERCOCET/ROXICET) 5-325 MG per tablet 1 tablet  1 tablet Oral Q6H PRN Tu, Ching T, DO   1 tablet at 11/20/22 0930   piperacillin-tazobactam (ZOSYN) IVPB 3.375 g  3.375 g Intravenous Q8H Maurice March, RPH 12.5 mL/hr at 11/20/22 1400 3.375 g at 11/20/22 1400   vancomycin (VANCOREADY) IVPB 1500 mg/300 mL  1,500 mg Intravenous Q24H Cindi Carbon, RPH 150 mL/hr at 11/19/22 2253 1,500 mg at 11/19/22 2253     Discharge Medications: Please see discharge summary for a list of discharge  medications.  Relevant Imaging Results:  Relevant Lab Results:   Additional Information SS# [id number]  Amada Jupiter, LCSW

## 2022-11-20 NOTE — Progress Notes (Signed)
  Progress Note   Patient: Derek Sims ZOX:[id number] DOB: 1955-12-05 DOA: 11/18/2022     1 DOS: the patient was seen and examined on 11/20/2022 at 1:30PM      Brief hospital course: 67 y.o. male with medical history significant of multiple sclerosis wheelchair bound, seizure disorder who presented with scrotal pain.  Found to have scrotal infection, CT uremarkable. Not clinically deep tissue gangrene.  Evaluated by Urology.     Assessment and Plan: * Scrotal cellulitis - Continue diflucan  - Continue antibiotics, de-escalate to Rocephin - Follow culture data - Consult Urology - Condom cath for now - Change dressing daily    Acute metabolic encephalopathy Superimposed on probably mild cognitive impairment from MS.  Here exacerbated by opiates. - Standard delirium precautions: blinds open and lights on during day, TV off, minimize interruptions at night, glasses/hearing aids, PT/OT, avoiding Beers list medications    Hypokalemia    Tobacco dependency    Hx of seizure disorder No seizure activity for many years. Not on antiepileptic    Multiple sclerosis (HCC) Wheelchair bound at baseline. Lives alone and does his own ADLS, on interferon.          Subjective: Pain slightly better but overall not much different.  No fever, no vomiting.  Slightly confused, no agitation.     Physical Exam: BP (!) 168/100 (BP Location: Left Arm)   Pulse 100   Temp 98 F (36.7 C) (Oral)   Resp 16   Ht 5' 11.5" (1.816 m)   SpO2 98%   BMI 22.74 kg/m   Elderly adult male, lying in bed, appears somewhat disheveled Tachycardic, regular, no murmurs, no peripheral edema Respiratory normal, lungs clear, no rales or wheezing Abdomen soft without tenderness palpation or guarding, no ascites or distention Somewhat inattentive, affect blunted, judgment and insight appear impaired, oriented to self and "hospital", has generalized weakness in all 4 extremities with some ataxia  and contracture    Data Reviewed: No new labs  Family Communication: None available    Disposition: Status is: Inpatient Patient was admitted for scrotal cellulitis due to inability to perform self hygiene  The patient will require a prolonged period of wound care of his scrotum which he is not able to provide for himself, and due to pain will be completely limited from his normal independent transfers          Author: Alberteen Sam, MD 11/20/2022 7:26 PM  For on call review [email or web address].

## 2022-11-21 DIAGNOSIS — G9341 Metabolic encephalopathy: Secondary | ICD-10-CM | POA: Diagnosis not present

## 2022-11-21 DIAGNOSIS — L03818 Cellulitis of other sites: Secondary | ICD-10-CM | POA: Diagnosis not present

## 2022-11-21 DIAGNOSIS — G35 Multiple sclerosis: Secondary | ICD-10-CM | POA: Diagnosis not present

## 2022-11-21 DIAGNOSIS — Z8669 Personal history of other diseases of the nervous system and sense organs: Secondary | ICD-10-CM | POA: Diagnosis not present

## 2022-11-21 LAB — CBC
HCT: 44.5 % (ref 39.0–52.0)
Hemoglobin: 14.6 g/dL (ref 13.0–17.0)
MCH: 29.6 pg (ref 26.0–34.0)
MCHC: 32.8 g/dL (ref 30.0–36.0)
MCV: 90.1 fL (ref 80.0–100.0)
Platelets: 229 10*3/uL (ref 150–400)
RBC: 4.94 MIL/uL (ref 4.22–5.81)
RDW: 13.2 % (ref 11.5–15.5)
WBC: 6.5 10*3/uL (ref 4.0–10.5)
nRBC: 0 % (ref 0.0–0.2)

## 2022-11-21 LAB — COMPREHENSIVE METABOLIC PANEL
ALT: 26 U/L (ref 0–44)
AST: 30 U/L (ref 15–41)
Albumin: 3.4 g/dL — ABNORMAL LOW (ref 3.5–5.0)
Alkaline Phosphatase: 64 U/L (ref 38–126)
Anion gap: 11 (ref 5–15)
BUN: 16 mg/dL (ref 8–23)
CO2: 26 mmol/L (ref 22–32)
Calcium: 8.8 mg/dL — ABNORMAL LOW (ref 8.9–10.3)
Chloride: 103 mmol/L (ref 98–111)
Creatinine, Ser: 1.05 mg/dL (ref 0.61–1.24)
GFR, Estimated: >60 mL/min (ref >60)
Glucose, Bld: 99 mg/dL (ref 70–99)
Potassium: 3.3 mmol/L — ABNORMAL LOW (ref 3.5–5.1)
Sodium: 140 mmol/L (ref 135–145)
Total Bilirubin: 0.6 mg/dL (ref 0.3–1.2)
Total Protein: 7 g/dL (ref 6.5–8.1)

## 2022-11-21 MED ORDER — TRAZODONE HCL 50 MG PO TABS
50.0000 mg | ORAL_TABLET | Freq: Every evening | ORAL | Status: DC | PRN
  Administered 2022-11-21 – 2022-11-22 (×2): 50 mg via ORAL
  Filled 2022-11-21 (×2): qty 1

## 2022-11-21 MED ORDER — SODIUM CHLORIDE 0.9 % IV SOLN
2.0000 g | INTRAVENOUS | Status: DC
  Administered 2022-11-21 – 2022-11-22 (×2): 2 g via INTRAVENOUS
  Filled 2022-11-21 (×2): qty 20

## 2022-11-21 MED ORDER — POTASSIUM CHLORIDE CRYS ER 20 MEQ PO TBCR
40.0000 meq | EXTENDED_RELEASE_TABLET | Freq: Once | ORAL | Status: AC
  Administered 2022-11-21: 40 meq via ORAL
  Filled 2022-11-21: qty 2

## 2022-11-21 MED ORDER — FLUCONAZOLE 200 MG PO TABS
200.0000 mg | ORAL_TABLET | Freq: Every day | ORAL | Status: DC
  Administered 2022-11-21 – 2022-11-22 (×2): 200 mg via ORAL
  Filled 2022-11-21 (×3): qty 1

## 2022-11-21 NOTE — Progress Notes (Signed)
  Progress Note   Patient: Derek Sims VHQ:[id number] DOB: [birth year] DOA: 11/18/2022     2 DOS: the patient was seen and examined on 11/21/2022   Brief hospital course: 67 year old man PMH multiple sclerosis, wheelchair bound, lives independently presented with scrotal fungal infection and bacterial superinfection.  Seen by urology.  Plan for SNF.  Consultants Urology   Procedures None  Assessment and Plan: * Scrotal cellulitis Slowly improving.  Continue Diflucan, ceftriaxone Continue wound care Continue management per urology. Plan for SNF    Acute metabolic encephalopathy Superimposed on probably mild cognitive impairment from MS.  Here exacerbated by opiates. Appears resolved at this point   Multiple sclerosis [facility] [place] [facility]) Wheelchair bound at baseline. Lives alone and does his own ADLS, on interferon.  Hx of seizure disorder No seizure activity for many years. Not on antiepileptic       Subjective:  Feels better  Physical Exam: Vitals:   11/20/22 2018 11/21/22 0602 11/21/22 1042 11/21/22 1148  BP: (!) 156/88 (!) 154/84  (!) 161/88  Pulse: 85 80  72  Resp: 20 18  18   Temp: 98.3 F (36.8 C) 98.1 F (36.7 C)  98.2 F (36.8 C)  TempSrc: Oral Oral  Oral  SpO2: 97% 98%  97%  Weight:   76.5 kg   Height:       Physical Exam Vitals reviewed.  Constitutional:      General: He is not in acute distress.    Appearance: He is not ill-appearing or toxic-appearing.  Cardiovascular:     Rate and Rhythm: Normal rate and regular rhythm.     Heart sounds: No murmur heard. Pulmonary:     Effort: Pulmonary effort is normal. No respiratory distress.     Breath sounds: No wheezing, rhonchi or rales.  Genitourinary:    Comments: Scrotum erythematous Neurological:     Mental Status: He is alert.  Psychiatric:        Mood and Affect: Mood normal.        Behavior: Behavior normal.     Data Reviewed: Labs from October 9: Potassium 3.3, remainder CMP unremarkable CBC  within normal limits  Family Communication: none  Disposition: Status is: Inpatient Remains inpatient appropriate because: scrotal cellulitis     Time spent: 25 minutes  Author: Brendia Sacks, MD 11/21/2022 7:24 PM  For on call review [email or web address].

## 2022-11-21 NOTE — Progress Notes (Signed)
Subjective: Patient still complaining of extreme pain despite the fact that he was getting all available narcotics last night.  He is sleeping on my arrival but easily awoken.  No acute events  Objective: Vital signs in last 24 hours: Temp:  [98 F (36.7 C)-98.3 F (36.8 C)] 98.1 F (36.7 C) (10/09 0602) Pulse Rate:  [80-100] 80 (10/09 0602) Resp:  [16-20] 18 (10/09 0602) BP: (154-173)/(84-100) 154/84 (10/09 0602) SpO2:  [97 %-99 %] 98 % (10/09 0602)  Assessment/Plan:  67 year old male with PMH significant for multiple sclerosis-wheelchair-bound and seizure disorder with severe superficial scrotal infection.  # Scrotal cellulitis Appears to be fungal in nature, +/- probable superimposed bacterial infection.  He remains on antifungals and now on Rocephin.  Swab was collected after over 24 hours of treatment and not surprisingly has not grown anything yet.  He will likely need to be treated empirically.  Could transition over to oral therapy at any time  No gas present in scrotum per CT A/P.  His excoriation has improved significantly over the last few days.  Most sloughing and eschar is no longer present after several days of Xeroform.  Patient has been extremely resistant to patient care including any bathing, physical examination, or wound care.  Regardless of the amount of narcotics administered he begins to scream at the top of his lungs and slams his legs closed with any attempts to touch him.  We had a long talk today about how it would be a effectively impossible to treat him and prevent this from happening again without some level of cooperation, and that could not include high-level narcotics for the rest of his life.  He expressed understanding and let me complete my first thorough physical examination over the last 3 days this morning.  I agree that he will need to go to a skilled nursing facility for wound care and ADLs.   Would recommend titrating narcotics down to only as  needed administration for wound care.  He has been confused difficult to arouse for the last 2 days.  Trend daily labs  Condom catheter  Keep scrotum elevated and dry  He reports that he is able to complete his ADLs by himself but he also says that he was unaware of the severity of his infection until I showed him a picture of it.  He would benefit from an assessment by Occupational Therapy before he discharges from the hospital.  Will see him daily     Intake/Output from previous day: 10/08 0701 - 10/09 0700 In: 480 [P.O.:480] Out: 1100 [Urine:1100]  Intake/Output this shift: No intake/output data recorded.  Physical Exam:  General: Alert and oriented CV: No cyanosis Lungs: equal chest rise Gu: Eschar and slough has largely fallen off.  Excoriated and wrapped in Xeroform gauze.  Lab Results: Recent Labs    11/18/22 2135 11/18/22 2145 11/21/22 0425  HGB 14.9 15.3 14.6  HCT 45.4 45.0 44.5   BMET Recent Labs    11/19/22 0404 11/20/22 0419 11/21/22 0425  NA 138  --  140  K 3.4*  --  3.3*  CL 105  --  103  CO2 24  --  26  GLUCOSE 94  --  99  BUN 18  --  16  CREATININE 1.02 1.03 1.05  CALCIUM 8.4*  --  8.8*     Studies/Results: No results found.    LOS: 2 days   Derek Kirschner, NP [facility] Pager: [phone]  11/21/2022, 8:50 AM

## 2022-11-21 NOTE — Plan of Care (Signed)

## 2022-11-21 NOTE — TOC Progression Note (Signed)
Transition of Care [facility]) - Progression Note    Patient Details  Name: KRISTEN BUSHWAY MRN: [id number] Date of Birth: 1955-09-27  Transition of Care [facility]) CM/SW Contact  Amada Jupiter, LCSW Phone Number: 11/21/2022, 1:36 PM  Clinical Narrative:     Have reviewed SNF bed offers with pt and he has accepted bed at [facility]  Sister updated.  Will begin insurance authorization.  MD aware.  Expected Discharge Plan: Home w Home Health Services Barriers to Discharge: Barriers Resolved  Expected Discharge Plan and Services     Post Acute Care Choice:  (none at tihis time) Living arrangements for the past 2 months: Apartment                                       Social Determinants of Health (SDOH) Interventions SDOH Screenings   Food Insecurity: No Food Insecurity (11/19/2022)  Housing: Low Risk  (11/19/2022)  Transportation Needs: No Transportation Needs (11/19/2022)  Utilities: Not At Risk (11/19/2022)  Tobacco Use: High Risk (11/18/2022)    Readmission Risk Interventions    11/20/2022    2:30 PM  Readmission Risk Prevention Plan  Post Dischage Appt Complete  Medication Screening Complete  Transportation Screening Complete

## 2022-11-21 NOTE — Progress Notes (Signed)
PHARMACY NOTE:  ANTIMICROBIAL  DOSAGE ADJUSTMENT  Current antimicrobial dosage: Ceftriaxone 1 g IV q24h + fluconazole 200 mg IV q24h  Indication: Scrotal infection    Antimicrobial dosage has been changed to:   -Ceftriaxone increased from 1 g IV q24h to 2 g IV q24h -Fluconazole 200 mg PO q24h  Additional comments: Noted urology note that antibiotics could be transitioned to PO. Given good bioavailability of fluconazole and emergent national IV fluid shortage, will transition fluconazole to PO today.   Pt improving per notes, SCr stable. Will sign off.   Cindi Carbon, PharmD 11/21/22 9:58 AM

## 2022-11-22 DIAGNOSIS — L03818 Cellulitis of other sites: Secondary | ICD-10-CM | POA: Diagnosis not present

## 2022-11-22 DIAGNOSIS — G35 Multiple sclerosis: Secondary | ICD-10-CM | POA: Diagnosis not present

## 2022-11-22 MED ORDER — OXYCODONE-ACETAMINOPHEN 5-325 MG PO TABS: 1.0000 | ORAL_TABLET | Freq: Four times a day (QID) | ORAL | Status: DC | PRN

## 2022-11-22 NOTE — Progress Notes (Signed)
Occupational Therapy Treatment Patient Details Name: Derek Sims MRN: [id number] DOB: [birth year] Today's Date: 11/22/2022   History of present illness 67 year old male with PMH significant for multiple sclerosis-wheelchair-bound and seizure disorder with severe superficial scrotal infection.   OT comments  Pt in bed upon therapy arrival and agreeable to participate in OT and PT co-tx. Session focused on sitting balance, functional transfers, bed mobility, and pain management techniques. Initially attempted to make scrotal sling to provide increased comfort when completing lateral scooting transfer. Although, since edema has decreased, scrotal sling was not required. Mesh underwear were donned to provide support to scrotum. Pt was able to participate in functional lateral scoot transfer from bed to/from recliner while trailing method without and with slide board. Pt was assisted back to bed versus left in chair d/t to poor sitting balance and decreased core strength. Pt continues to be appropriate for inpatient follow up therapy, <3 hours/day. OT will continue to follow patient acutely.         If plan is discharge home, recommend the following:  Two people to help with walking and/or transfers;Two people to help with bathing/dressing/bathroom;Assistance with cooking/housework;Assistance with feeding;Direct supervision/assist for medications management;Direct supervision/assist for financial management;Assist for transportation;Help with stairs or ramp for entrance;Supervision due to cognitive status   Equipment Recommendations  Other (comment) (defer to next venue of care)       Precautions / Restrictions Precautions Precautions: Fall;Other (comment) Precaution Comments: painful scrotum Restrictions Weight Bearing Restrictions: No       Mobility Bed Mobility Overal bed mobility: Needs Assistance Bed Mobility: Supine to Sit, Sit to Supine     Supine to sit: Max assist, +2  for physical assistance, HOB elevated, Used rails Sit to supine: Max assist, +2 for physical assistance, Used rails   General bed mobility comments: VC provided for technique and sequencing. HOB elevated to assist with bringing trunk up off bed. Pt flucuated between Mod-SBA for trunk support. Pt assisted with boosting towards HOB by pulling up on head board when returning to bed. Required assist to bring BLE off and back onto bed.    Transfers Overall transfer level: Needs assistance Equipment used: Sliding board, None Transfers: Bed to chair/wheelchair/BSC            Lateral/Scoot Transfers: Total assist, +2 physical assistance, +2 safety/equipment, With slide board, From elevated surface General transfer comment: VC provided for technique and sequencing for all functional transfers. Bed pad used only and lateral scoot transfer to slide hips from bed to recliner in slow segments. When returning to bed from drop arm recliner, slide board was used. Total Assist for board placement and bed pads were used with 2 person assist to slide hips back onto bed. Pt did assist with BUE as able. Mod A provided to BLE positioning and stability as needed.     Balance Overall balance assessment: History of Falls, Needs assistance Sitting-balance support: Bilateral upper extremity supported, Feet supported Sitting balance-Leahy Scale: Poor Sitting balance - Comments: Seated EOB requiring intermittient physical support to maintain static sitting balance. Decreased core strength and control noted. Postural control: Posterior lean, Right lateral lean Standing balance support:  (N/A)        ADL either performed or assessed with clinical judgement   ADL Overall ADL's : Needs assistance/impaired    Lower Body Dressing: Maximal assistance;Cueing for sequencing;Bed level Lower Body Dressing Details (indicate cue type and reason): Donned mesh underwear to assist with supporting scrotum.  Cognition Arousal: Alert Behavior During Therapy: WFL for tasks assessed/performed, Anxious (slightly less anxious than previous session) Overall Cognitive Status: No family/caregiver present to determine baseline cognitive functioning Area of Impairment: Memory, Orientation, Attention, Following commands, Safety/judgement, Awareness, Problem solving    Orientation Level: Disoriented to, Situation Current Attention Level: Focused Memory: Decreased short-term memory Following Commands: Follows one step commands with increased time, Follows one step commands consistently Safety/Judgement: Decreased awareness of safety, Decreased awareness of deficits Awareness: Intellectual Problem Solving: Slow processing, Decreased initiation, Difficulty sequencing, Requires verbal cues, Requires tactile cues General Comments: Pain continues to be present although less than previous session which allowed pt to participate and attempt all activities suggested by therapists        Exercises      Shoulder Instructions       General Comments Condom cath on during session. Decreased scrotal edema noted although redness and raw skin still present.    Pertinent Vitals/ Pain       Pain Assessment Pain Assessment: Faces Faces Pain Scale: Hurts worst Pain Location: scrotum with any movement or bed mobility Pain Descriptors / Indicators: Discomfort, Moaning, Guarding, Grimacing, Other (Comment) Pain Intervention(s): Repositioned, Monitored during session, Limited activity within patient's tolerance         Frequency  Min 1X/week        Progress Toward Goals  OT Goals(current goals can now be found in the care plan section)  Progress towards OT goals: Progressing toward goals         Co-evaluation    PT/OT/SLP Co-Evaluation/Treatment: Yes Reason for Co-Treatment: Complexity of the patient's impairments (multi-system involvement);For patient/therapist safety;To address functional/ADL  transfers PT goals addressed during session: Mobility/safety with mobility OT goals addressed during session: ADL's and self-care      AM-PAC OT "6 Clicks" Daily Activity     Outcome Measure   Help from another person eating meals?: A Little Help from another person taking care of personal grooming?: A Lot Help from another person toileting, which includes using toliet, bedpan, or urinal?: Total Help from another person bathing (including washing, rinsing, drying)?: Total Help from another person to put on and taking off regular upper body clothing?: A Lot Help from another person to put on and taking off regular lower body clothing?: A Lot 6 Click Score: 11    End of Session Equipment Utilized During Treatment: Other (comment) (slideboard)  OT Visit Diagnosis: Repeated falls (R29.6);Muscle weakness (generalized) (M62.81);History of falling (Z91.81);Ataxia, unspecified (R27.0);Other symptoms and signs involving the nervous system (R29.898);Pain Pain - Right/Left:  (N/A) Pain - part of body:  (scrotum)   Activity Tolerance Patient tolerated treatment well;Patient limited by pain   Patient Left in bed;with call bell/phone within reach;with bed alarm set   Nurse Communication Mobility status        Time: 7829-5621 OT Time Calculation (min): 38 min  Charges: OT General Charges $OT Visit: 1 Visit OT Treatments $Self Care/Home Management : 8-22 mins  Limmie Patricia, OTR/L,CBIS  Supplemental OT - [facility] and [facility] Secure Chat Preferred    Jamire Shabazz, Charisse March 11/22/2022, 4:15 PM

## 2022-11-22 NOTE — Progress Notes (Signed)
Physical Therapy Treatment Patient Details Name: Derek Sims MRN: [id number] DOB: Dec 22, 1955 Today's Date: 11/22/2022   History of Present Illness 67 year old male with PMH significant for multiple sclerosis-wheelchair-bound and seizure disorder with severe superficial scrotal infection.    PT Comments   Patient awake  with decreased responses, staring out, returns to attention with  name called.  The OT  placed a scrotal sling to facilitate patient moving in bed and transfers.  Patient reports painful in the area but definitely tolerated more mobility this visit.  + 2 max/ assist to slid/scoot to recliner with drop arm then  used sliding board and +2 max total to scoot back to bed. Patient using Bilateral UE's to support and lift as  he scooted laterally.  Anticipate as pain in groin are subsides, patient will be able to progress  and return to his modified  independent level with post acute rehab.Patient will benefit from continued inpatient follow up therapy, <3 hours/day    If plan is discharge home, recommend the following: Two people to help with walking and/or transfers;A lot of help with bathing/dressing/bathroom;Assistance with cooking/housework;Assist for transportation   Can travel by private vehicle     No  Equipment Recommendations  None recommended by PT    Recommendations for Other Services       Precautions / Restrictions Precautions Precautions: Fall;Other (comment) Precaution Comments: painful scrotum Restrictions Weight Bearing Restrictions: No     Mobility  Bed Mobility                    Transfers                        Ambulation/Gait                   Stairs             Wheelchair Mobility     Tilt Bed    Modified Rankin (Stroke Patients Only)       Balance Overall balance assessment: History of Falls, Needs assistance Sitting-balance support: Bilateral upper extremity supported, Feet  supported Sitting balance-Leahy Scale: Poor Sitting balance - Comments: Seated EOB requiring intermittient physical support to maintain static sitting balance. Decreased core strength and control noted. At times noted spasms in truk and legs into posterior lean                                    Cognition Arousal: Alert Behavior During Therapy: WFL for tasks assessed/performed, Anxious Overall Cognitive Status: No family/caregiver present to determine baseline cognitive functioning Area of Impairment: Memory, Orientation, Attention, Following commands, Safety/judgement, Awareness, Problem solving                 Orientation Level: Disoriented to, Situation Current Attention Level: Focused Memory: Decreased short-term memory Following Commands: Follows one step commands with increased time, Follows one step commands consistently Safety/Judgement: Decreased awareness of safety, Decreased awareness of deficits Awareness: Intellectual Problem Solving: Slow processing, Decreased initiation, Difficulty sequencing, Requires verbal cues, Requires tactile cues General Comments: Pain continues to be present although less than previous session which allowed pt to participate and attempt all activities suggested by therapists, at time patient appears "Phased out, staring at TV."        Exercises      General Comments General comments (skin integrity, edema, etc.): Condom cath on during session. Decreased scrotal  edema noted although redness and raw skin still present.      Pertinent Vitals/Pain Pain Assessment Faces Pain Scale: Hurts worst Pain Location: scrotum with any movement or bed mobility Pain Descriptors / Indicators: Discomfort, Moaning, Guarding, Grimacing, Other (Comment) Pain Intervention(s): Monitored during session, Premedicated before session    Home Living                          Prior Function            PT Goals (current goals can now  be found in the care plan section) Progress towards PT goals: Progressing toward goals    Frequency    Min 1X/week      PT Plan      Co-evaluation PT/OT/SLP Co-Evaluation/Treatment: Yes Reason for Co-Treatment: Complexity of the patient's impairments (multi-system involvement);For patient/therapist safety;To address functional/ADL transfers PT goals addressed during session: Mobility/safety with mobility OT goals addressed during session: ADL's and self-care      AM-PAC PT "6 Clicks" Mobility   Outcome Measure  Help needed turning from your back to your side while in a flat bed without using bedrails?: A Lot Help needed moving from lying on your back to sitting on the side of a flat bed without using bedrails?: A Lot Help needed moving to and from a bed to a chair (including a wheelchair)?: Total Help needed standing up from a chair using your arms (e.g., wheelchair or bedside chair)?: Total Help needed to walk in hospital room?: Total Help needed climbing 3-5 steps with a railing? : Total 6 Click Score: 8    End of Session   Activity Tolerance: Patient tolerated treatment well Patient left: in bed;with call bell/phone within reach;with bed alarm set Nurse Communication: Mobility status PT Visit Diagnosis: Unsteadiness on feet (R26.81);History of falling (Z91.81);Other symptoms and signs involving the nervous system (R29.898)     Time: 7829-5621 PT Time Calculation (min) (ACUTE ONLY): 39 min  Charges:    $Therapeutic Activity: 8-22 mins PT General Charges $$ ACUTE PT VISIT: 1 Visit                     Blanchard Kelch PT Acute Rehabilitation Services Office [phone] Weekend pager-[phone]    Rada Hay 11/22/2022, 4:18 PM

## 2022-11-22 NOTE — Progress Notes (Signed)
  Progress Note   Patient: Derek Sims:[id number] DOB: 02/07/1956 DOA: 11/18/2022     3 DOS: the patient was seen and examined on 11/22/2022   Brief hospital course: 67 year old man PMH multiple sclerosis, wheelchair bound, lives independently presented with scrotal fungal infection and bacterial superinfection.  Seen by urology.  Plan for SNF.  Consultants Urology   Procedures None  Assessment and Plan: * Scrotal cellulitis with fungal overgrowth Slowly improving.  Continue Diflucan, ceftriaxone Continue wound care Continue management per urology. Plan for SNF  Participated better with physical therapy and Occupational Therapy today.   Acute metabolic encephalopathy Superimposed on probably mild cognitive impairment from MS.  Here exacerbated by opiates. Appears resolved at this point   Multiple sclerosis [facility]) Wheelchair bound at baseline. Lives alone and does his own ADLs, on interferon.   Hx of seizure disorder No seizure activity for many years. Not on antiepileptic       Subjective:  Feels better Still has some scrotal pain  Physical Exam: Vitals:   11/21/22 1148 11/21/22 2155 11/22/22 0640 11/22/22 1204  BP: (!) 161/88 (!) 150/87 (!) 160/89 133/86  Pulse: 72 78 69 83  Resp: 18 18 18  (!) 22  Temp: 98.2 F (36.8 C) 98 F (36.7 C) 97.7 F (36.5 C) 97.8 F (36.6 C)  TempSrc: Oral Oral Oral   SpO2: 97% 95% 99% 94%  Weight:      Height:       Physical Exam Vitals reviewed.  Constitutional:      General: He is not in acute distress.    Appearance: He is not ill-appearing or toxic-appearing.  Cardiovascular:     Rate and Rhythm: Normal rate and regular rhythm.     Heart sounds: No murmur heard. Pulmonary:     Effort: Pulmonary effort is normal. No respiratory distress.     Breath sounds: No wheezing, rhonchi or rales.  Genitourinary:    Comments: Scrotum less erythematous, skin is raw.  Tender to palpation. Neurological:     Mental  Status: He is alert.  Psychiatric:        Mood and Affect: Mood normal.        Behavior: Behavior normal.     Data Reviewed: No new data  Family Communication: none  Disposition: Status is: Inpatient Remains inpatient appropriate because: wound care     Time spent: 35 minutes  Author: Brendia Sacks, MD 11/22/2022 6:47 PM  For on call review [email or web address].

## 2022-11-22 NOTE — TOC Progression Note (Signed)
Transition of Care [facility]) - Progression Note    Patient Details  Name: Derek Sims MRN: [id number] Date of Birth: 1955-12-09  Transition of Care [facility]) CM/SW Contact  Amada Jupiter, LCSW Phone Number: 11/22/2022, 3:56 PM  Clinical Narrative:    Initial insurance authorization request for SNF to insurance and denial upheld even with peer to peer call.  However,  recommendation made to submit another authorization with updated therapy notes and notes addressing pain management.  Have updated pt/ sister that will submit new auth once documentation in.  [facility] updated and they are holding bed for pt and hopeful to get auth by tomorrow if possible.   Expected Discharge Plan: Home w Home Health Services Barriers to Discharge: Barriers Resolved  Expected Discharge Plan and Services     Post Acute Care Choice:  (none at tihis time) Living arrangements for the past 2 months: Apartment                                       Social Determinants of Health (SDOH) Interventions SDOH Screenings   Food Insecurity: No Food Insecurity (11/19/2022)  Housing: Low Risk  (11/19/2022)  Transportation Needs: No Transportation Needs (11/19/2022)  Utilities: Not At Risk (11/19/2022)  Tobacco Use: High Risk (11/18/2022)    Readmission Risk Interventions    11/20/2022    2:30 PM  Readmission Risk Prevention Plan  Post Dischage Appt Complete  Medication Screening Complete  Transportation Screening Complete

## 2022-11-23 DIAGNOSIS — G35 Multiple sclerosis: Secondary | ICD-10-CM | POA: Diagnosis not present

## 2022-11-23 DIAGNOSIS — G8220 Paraplegia, unspecified: Secondary | ICD-10-CM | POA: Insufficient documentation

## 2022-11-23 DIAGNOSIS — L03818 Cellulitis of other sites: Secondary | ICD-10-CM | POA: Diagnosis not present

## 2022-11-23 LAB — CULTURE, BLOOD (ROUTINE X 2)
Culture: NO GROWTH
Culture: NO GROWTH

## 2022-11-23 MED ORDER — FLUCONAZOLE 200 MG PO TABS: 200.0000 mg | ORAL_TABLET | Freq: Every day | ORAL | Status: AC

## 2022-11-23 MED ORDER — OXYCODONE-ACETAMINOPHEN 5-325 MG PO TABS: 1.0000 | ORAL_TABLET | Freq: Four times a day (QID) | ORAL | 0 refills | Status: AC | PRN

## 2022-11-23 MED ORDER — AMOXICILLIN-POT CLAVULANATE 875-125 MG PO TABS: 1.0000 | ORAL_TABLET | Freq: Two times a day (BID) | ORAL | Status: AC

## 2022-11-23 MED ORDER — ACETAMINOPHEN 325 MG PO TABS: 650.0000 mg | ORAL_TABLET | Freq: Four times a day (QID) | ORAL | Status: AC | PRN

## 2022-11-23 MED ORDER — AMOXICILLIN-POT CLAVULANATE 875-125 MG PO TABS
1.0000 | ORAL_TABLET | Freq: Two times a day (BID) | ORAL | Status: DC
  Administered 2022-11-23: 1 via ORAL
  Filled 2022-11-23: qty 1

## 2022-11-23 MED ORDER — FLUCONAZOLE 200 MG PO TABS: 200.0000 mg | ORAL_TABLET | Freq: Every day | ORAL | Status: DC

## 2022-11-23 MED ORDER — AMOXICILLIN-POT CLAVULANATE 875-125 MG PO TABS: 1.0000 | ORAL_TABLET | Freq: Two times a day (BID) | ORAL | Status: DC

## 2022-11-23 NOTE — TOC Transition Note (Signed)
Transition of Care [facility]) - CM/SW Discharge Note   Patient Details  Name: Derek Sims MRN: [id number] Date of Birth: 06/17/1955  Transition of Care [facility]) CM/SW Contact:  Amada Jupiter, LCSW Phone Number: 11/23/2022, 2:23 PM   Clinical Narrative:    Have received insurance authorization for SNF and bed accepted at [facility].  MD has medically cleared pt for dc today.  Pt and sister aware and agreeable.  PTAR called at 1420.  RN to call report to [phone].  No further TOC needs.   Final next level of care: Skilled Nursing Facility Barriers to Discharge: Barriers Resolved   Patient Goals and CMS Choice CMS Medicare.gov Compare Post Acute Care list provided to:: Patient Choice offered to / list presented to : Patient  Discharge Placement PASRR number recieved: 11/20/22 PASRR number recieved: 11/20/22            Patient chooses bed at: [facility] Patient to be transferred to facility by: PTAR Name of family member notified: sister Patient and family notified of of transfer: 11/23/22  Discharge Plan and Services Additional resources added to the After Visit Summary for       Post Acute Care Choice:  (none at tihis time)          DME Arranged: N/A DME Agency: NA                  Social Determinants of Health (SDOH) Interventions SDOH Screenings   Food Insecurity: No Food Insecurity (11/19/2022)  Housing: Low Risk  (11/19/2022)  Transportation Needs: No Transportation Needs (11/19/2022)  Utilities: Not At Risk (11/19/2022)  Tobacco Use: High Risk (11/18/2022)     Readmission Risk Interventions    11/20/2022    2:30 PM  Readmission Risk Prevention Plan  Post Dischage Appt Complete  Medication Screening Complete  Transportation Screening Complete

## 2022-11-23 NOTE — Discharge Summary (Addendum)
Physician Discharge Summary   Patient: Derek Sims MRN: [id number] DOB: [birth year]  Admit date:     11/18/2022  Discharge date: 11/23/22  Discharge Physician: Brendia Sacks   PCP: Pcp, No   Recommendations at discharge:   * Scrotal cellulitis with fungal overgrowth Slowly improving.  Continue Diflucan, Augmentin Continue wound care: Apply Xeroform gauze to scrotum wounds every day, then cover with ABD pads and hold in place with mesh underwear  Discharge Diagnoses: Principal Problem:   Scrotal cellulitis Active Problems:   Acute metabolic encephalopathy   Multiple sclerosis (HCC)   Hx of seizure disorder   Tobacco dependency   Hypokalemia   Paraplegia (HCC)  Resolved Problems:   * No resolved hospital problems. *  Hospital Course: 67 year old man PMH multiple sclerosis, wheelchair bound, lives independently presented with scrotal fungal infection and bacterial superinfection.  Seen by urology, recommended conservative management with antibiotics and wound care.  No role for surgical intervention.  Plan for SNF.  Consultants Urology   Procedures None  * Scrotal cellulitis with fungal overgrowth Slowly improving.  Continue Diflucan, ceftriaxone Continue wound care: Apply Xeroform gauze to scrotum wounds Q day, then cover with ABD pads and hold in place with mesh underwear Follow-up with PCP Plan for SNF    Acute metabolic encephalopathy Superimposed on probably mild cognitive impairment from MS.  Here exacerbated by opiates. Appears resolved at this point   Multiple sclerosis (HCC) Paraplegia  Wheelchair bound at baseline. Lives alone and does his own ADLs, on interferon.   Hx of seizure disorder No seizure activity for many years. Not on antiepileptic      Pain control - [facility] Controlled Substance Reporting System database was reviewed.   Disposition: Skilled nursing facility Diet recommendation:  Regular diet DISCHARGE  MEDICATION: Allergies as of 11/23/2022   No Known Allergies      Medication List     STOP taking these medications    Interferon Beta-1b 0.3 MG Kit injection Commonly known as: BETASERON/EXTAVIA   nicotine 21 mg/24hr patch Commonly known as: NICODERM CQ - dosed in mg/24 hours       TAKE these medications    acetaminophen 325 MG tablet Commonly known as: TYLENOL Take 2 tablets (650 mg total) by mouth every 6 (six) hours as needed for mild pain, fever or headache. What changed:  medication strength how much to take when to take this reasons to take this   amoxicillin-clavulanate 875-125 MG tablet Commonly known as: AUGMENTIN Take 1 tablet by mouth every 12 (twelve) hours for 12 days. Last dose 10/22   Catheter Adapter Misc   fluconazole 200 MG tablet Commonly known as: DIFLUCAN Take 1 tablet (200 mg total) by mouth at bedtime for 10 days. Last dose 10/20   oxyCODONE-acetaminophen 5-325 MG tablet Commonly known as: PERCOCET/ROXICET Take 1-2 tablets by mouth every 6 (six) hours as needed for up to 5 days for moderate pain or severe pain.   Urinary Leg Bag Misc               Discharge Care Instructions  (From admission, onward)           Start     Ordered   11/23/22 0000  Discharge wound care:       Comments: Wound care  Daily Apply Xeroform gauze to scrotum wounds every day, then cover with ABD pads and hold in place with mesh underwear   11/23/22 1321  Contact information for after-discharge care     Destination     [facility] Preferred SNF .   Service: Skilled Nursing Contact information: [address] [place] [address] [phone]                    Scrotum is painful, but getting better  Discharge Exam: Filed Weights   11/21/22 1042  Weight: 76.5 kg   Physical Exam Vitals reviewed.  Constitutional:      General: He is not in acute distress.    Appearance: He  is not ill-appearing or toxic-appearing.  Cardiovascular:     Rate and Rhythm: Normal rate and regular rhythm.     Heart sounds: No murmur heard. Pulmonary:     Effort: Pulmonary effort is normal. No respiratory distress.     Breath sounds: No wheezing, rhonchi or rales.  Genitourinary:    Comments: Scrotum is somewhat erythematous and tender with some excoriation Neurological:     Mental Status: He is alert.  Psychiatric:        Mood and Affect: Mood normal.        Behavior: Behavior normal.      Condition at discharge: good  The results of significant diagnostics from this hospitalization (including imaging, microbiology, ancillary and laboratory) are listed below for reference.   Imaging Studies: CT ABDOMEN PELVIS W CONTRAST  Result Date: 11/18/2022 CLINICAL DATA:  Brought in by ambulance for testicular pain. Query Fournier's gangrene. EXAM: CT ABDOMEN AND PELVIS WITH CONTRAST TECHNIQUE: Multidetector CT imaging of the abdomen and pelvis was performed using the standard protocol following bolus administration of intravenous contrast. RADIATION DOSE REDUCTION: This exam was performed according to the departmental dose-optimization program which includes automated exposure control, adjustment of the mA and/or kV according to patient size and/or use of iterative reconstruction technique. CONTRAST:  OMNIPAQUE IOHEXOL 300 MG/ML  SOLN COMPARISON:  CT abdomen and pelvis 06/02/2021 FINDINGS: Lower chest: No acute abnormality. Hepatobiliary: Hepatic steatosis. Cholelithiasis without evidence of cholecystitis. No biliary dilation. Pancreas: Unremarkable. Spleen: Unremarkable. Adrenals/Urinary Tract: Normal adrenal glands. Low-attenuation lesions in the kidneys are statistically likely to represent cysts. No follow-up is required. Tiny nonobstructing left nephrolithiasis. No obstructing urinary calculi or hydronephrosis. Right anterior and posterior bladder diverticula. Stomach/Bowel: Normal  caliber large and small bowel. No bowel wall thickening. Normal appendix and stomach. Vascular/Lymphatic: Aortic atherosclerosis. No enlarged abdominal or pelvic lymph nodes. Reproductive: Unremarkable prostate. Partially visualized small right hydrocele. No visualized testicular wall thickening or soft tissue gas. Other: No free intraperitoneal fluid or air. Musculoskeletal: No acute fracture.  Right femoral head AVN. IMPRESSION: 1. Partially visualized small right hydrocele. No visualized scrotal wall thickening or soft tissue gas. 2. Cholelithiasis without evidence of cholecystitis. 3. Hepatic steatosis. 4. Right femoral head AVN. Aortic Atherosclerosis (ICD10-I70.0). Electronically Signed   By: Minerva Fester M.D.   On: 11/18/2022 23:28    Microbiology: Results for orders placed or performed during the hospital encounter of 11/18/22  Blood culture (routine x 2)     Status: None   Collection Time: 11/18/22  9:29 PM   Specimen: BLOOD LEFT FOREARM  Result Value Ref Range Status   Specimen Description BLOOD LEFT FOREARM  Final   Special Requests   Final    BOTTLES DRAWN AEROBIC AND ANAEROBIC Blood Culture results may not be optimal due to an excessive volume of blood received in culture bottles   Culture   Final    NO GROWTH 5  DAYS Performed at [facility], [address], [place], [place] [address]    Report Status 11/23/2022 FINAL  Final  Blood culture (routine x 2)     Status: None   Collection Time: 11/18/22  9:35 PM   Specimen: BLOOD  Result Value Ref Range Status   Specimen Description BLOOD RIGHT ANTECUBITAL  Final   Special Requests   Final    BOTTLES DRAWN AEROBIC AND ANAEROBIC Blood Culture results may not be optimal due to an excessive volume of blood received in culture bottles   Culture   Final    NO GROWTH 5 DAYS Performed at [facility], [address], [place], [place] [address]    Report Status 11/23/2022 FINAL  Final  Aerobic Culture w Gram Stain  (superficial specimen)     Status: None (Preliminary result)   Collection Time: 11/20/22  4:20 PM   Specimen: Wound  Result Value Ref Range Status   Specimen Description WOUND  Final   Special Requests NONE  Final   Gram Stain NO WBC SEEN RARE GRAM POSITIVE COCCI IN PAIRS   Final   Culture   Final    RARE ENTEROCOCCUS FAECALIS RARE PROTEUS MIRABILIS CULTURE REINCUBATED FOR BETTER GROWTH RARE KLEBSIELLA OXYTOCA SUSCEPTIBILITIES TO FOLLOW    Report Status PENDING  Incomplete   Organism ID, Bacteria ENTEROCOCCUS FAECALIS  Final      Susceptibility   Enterococcus faecalis - MIC*    AMPICILLIN <=2 SENSITIVE Sensitive     VANCOMYCIN 1 SENSITIVE Sensitive     GENTAMICIN SYNERGY Value in next row Sensitive      SENSITIVEPerformed at [facility], [address] [place], [place] [address]    * RARE ENTEROCOCCUS FAECALIS    Labs: CBC: Recent Labs  Lab 11/18/22 2135 11/18/22 2145 11/21/22 0425  WBC 10.3  --  6.5  NEUTROABS 6.2  --   --   HGB 14.9 15.3 14.6  HCT 45.4 45.0 44.5  MCV 90.1  --  90.1  PLT 234  --  229   Basic Metabolic Panel: Recent Labs  Lab 11/18/22 2135 11/18/22 2145 11/19/22 0404 11/20/22 0419 11/21/22 0425  NA 139 141 138  --  140  K 3.7 3.9 3.4*  --  3.3*  CL 108 108 105  --  103  CO2 19*  --  24  --  26  GLUCOSE 99 100* 94  --  99  BUN 19 20 18   --  16  CREATININE 0.88 0.90 1.02 1.03 1.05  CALCIUM 9.3  --  8.4*  --  8.8*   Liver Function Tests: Recent Labs  Lab 11/18/22 2135 11/21/22 0425  AST 29 30  ALT 22 26  ALKPHOS 63 64  BILITOT 1.0 0.6  PROT 7.2 7.0  ALBUMIN 3.8 3.4*   CBG: No results for input(s): "GLUCAP" in the last 168 hours.  Discharge time spent: greater than 30 minutes.  Signed: Brendia Sacks, MD [facility] 11/23/2022

## 2022-11-23 NOTE — Plan of Care (Signed)

## 2022-11-23 NOTE — Progress Notes (Signed)
     Subjective: Patient still complaining of extreme pain despite the fact that he was getting all available narcotics last night.  He is sleeping on my arrival but easily awoken.  No acute events  Objective: Vital signs in last 24 hours: Temp:  [97.7 F (36.5 C)-97.8 F (36.6 C)] 97.7 F (36.5 C) (10/11 0540) Pulse Rate:  [68-83] 68 (10/11 0540) Resp:  [19-22] 19 (10/11 0540) BP: (133-150)/(76-86) 150/76 (10/11 0540) SpO2:  [94 %-97 %] 97 % (10/11 0540)  Assessment/Plan:  67 year old male with PMH significant for multiple sclerosis-wheelchair-bound and seizure disorder with severe superficial scrotal infection.  # Scrotal cellulitis Appears to be fungal in nature, +/- probable superimposed bacterial infection.  He remains on antifungals and now on Rocephin.  A little bit of enterococcus has grown on his swab.  Not surprising considering he is still having difficulty with ADLs and some incontinence of stool.  This would not be covered by the Rocephin, though he did get 2 days of vancomycin which may be adequate  No gas present in scrotum per CT A/P.    Continue Xeroform scrotal dressings per wound care.   Trend daily labs  Condom catheter  Keep scrotum elevated and dry  Appreciate input from PT/OT  He has been moving and an expected course for several days now and has accepted a bed at a skilled nursing facility.  We will be available peripherally and if he is still here Monday morning I will check on him again.     Intake/Output from previous day: No intake/output data recorded.  Intake/Output this shift: No intake/output data recorded.  Physical Exam:  General: Alert and oriented CV: No cyanosis Lungs: equal chest rise Gu: Eschar and slough has largely fallen off.  Excoriated and wrapped in Xeroform gauze.  Lab Results: Recent Labs    11/21/22 0425  HGB 14.6  HCT 44.5   BMET Recent Labs    11/21/22 0425  NA 140  K 3.3*  CL 103  CO2 26  GLUCOSE 99   BUN 16  CREATININE 1.05  CALCIUM 8.8*     Studies/Results: No results found.    LOS: 4 days   Elmon Kirschner, NP [facility] Pager: [phone]  11/23/2022, 10:20 AM

## 2022-11-23 NOTE — Plan of Care (Addendum)
  Problem: Education: Goal: Knowledge of General Education information will improve Description: Including pain rating scale, medication(s)/side effects and non-pharmacologic comfort measures 11/23/2022 1815 by Marjean Imperato T, RN Outcome: Progressing 11/23/2022 1705 by Mahir Prabhakar T, RN Outcome: Progressing   Problem: Health Behavior/Discharge Planning: Goal: Ability to manage health-related needs will improve 11/23/2022 1815 by Urho Rio T, RN Outcome: Progressing 11/23/2022 1705 by Johnathen Testa T, RN Outcome: Progressing   Problem: Clinical Measurements: Goal: Will remain free from infection 11/23/2022 1815 by Constance Hackenberg T, RN Outcome: Progressing 11/23/2022 1705 by Elaf Clauson T, RN Outcome: Progressing Goal: Respiratory complications will improve Outcome: Progressing Goal: Cardiovascular complication will be avoided Outcome: Progressing   Problem: Nutrition: Goal: Adequate nutrition will be maintained Outcome: Progressing   Problem: Elimination: Goal: Will not experience complications related to urinary retention Outcome: Progressing Patient was transferred to [facility] facility room 310 and Report was given to [facility] at 1629. The patient left the unit at 1821 in a stable condition via [place] [facility], AVS was printed and given to [employer].

## 2022-11-24 LAB — AEROBIC CULTURE W GRAM STAIN (SUPERFICIAL SPECIMEN): Gram Stain: NONE SEEN

## 2022-12-06 ENCOUNTER — Ambulatory Visit: Payer: Medicare PPO | Admitting: Internal Medicine

## 2023-02-24 IMAGING — CT CT HEAD W/O CM
4 series · 16 of 47 positions shown, 18 images · non-contrast
Comparison: None.

CLINICAL DATA: Altered mental status



[Series 3: head without · axial · non-contrast · 0.46mm/px · z∈[-129,+1]mm · 7 of 36 slices shown, 9 images]
[im 5/36  brain]
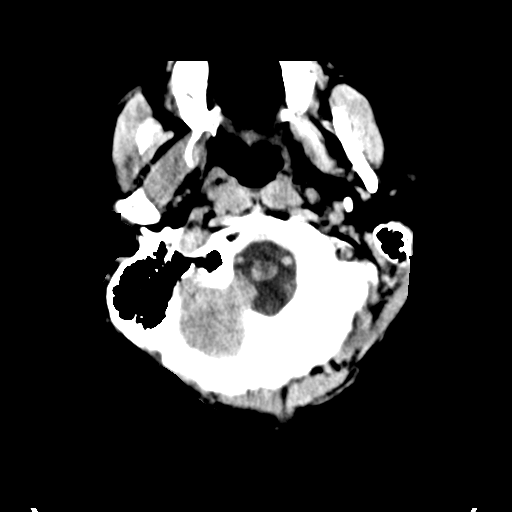
[im 5/36  bone]
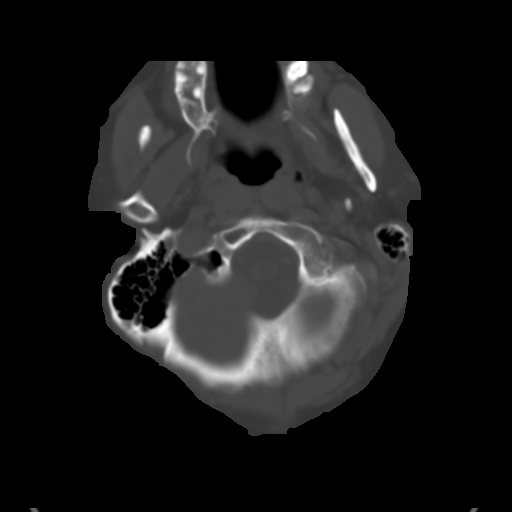
[im 9/36  brain]
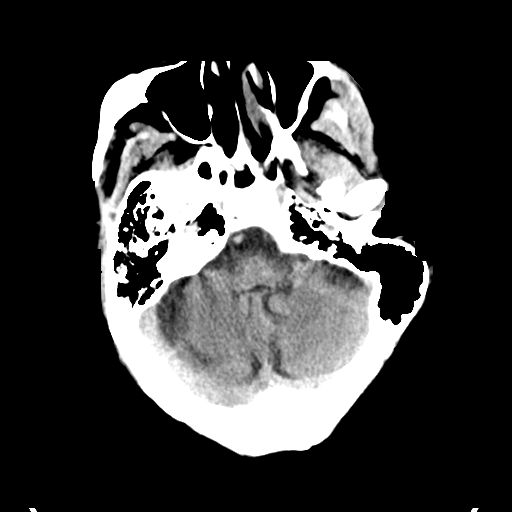
[im 14/36  brain]
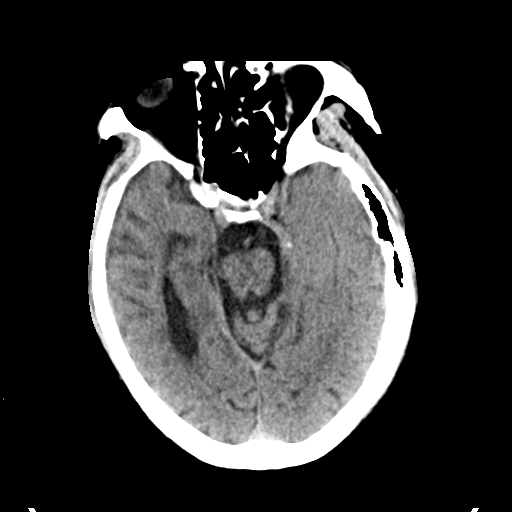
[im 18/36  brain]
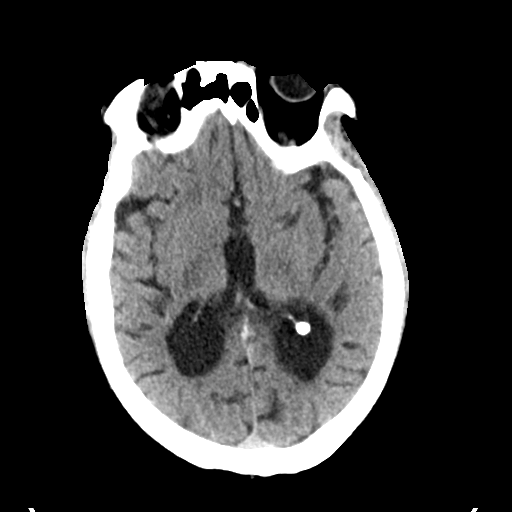
[im 22/36  brain]
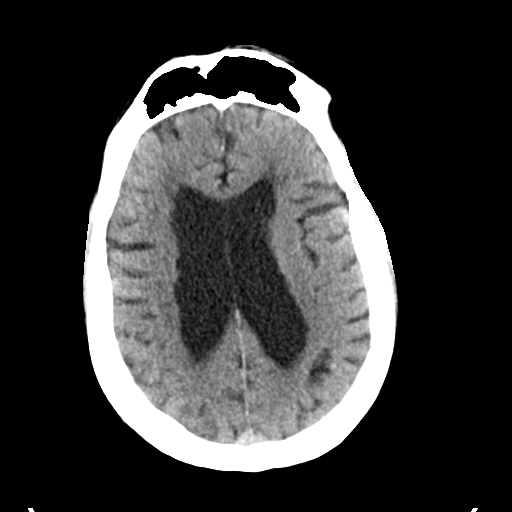
[im 22/36  bone]
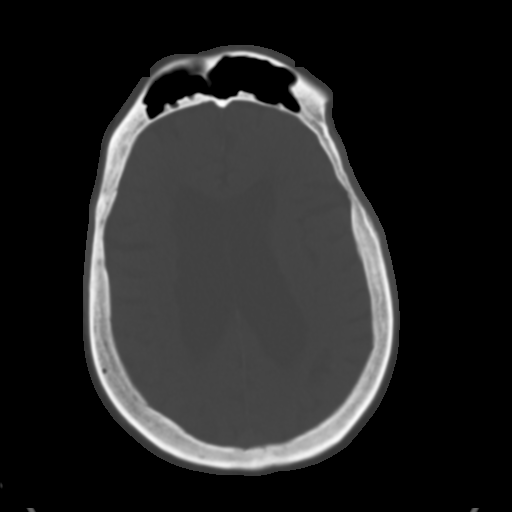
[im 27/36  brain]
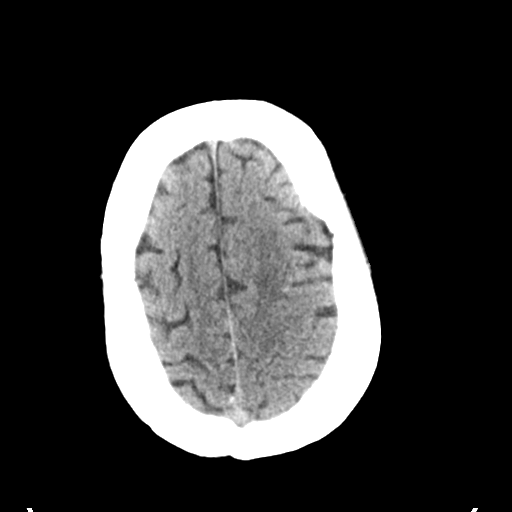
[im 31/36  brain]
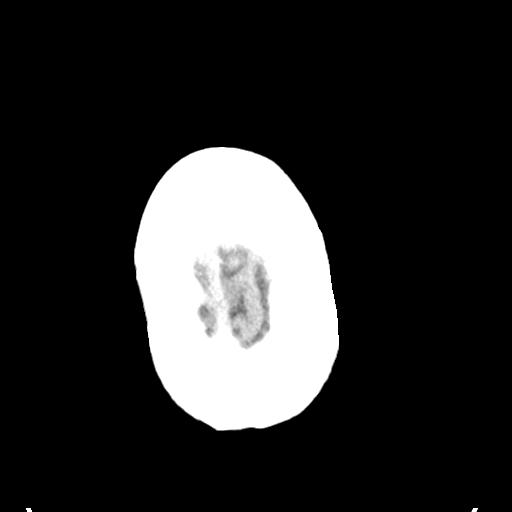

[Series 4: head bone · axial · 0.46mm/px · z∈[-133,-97]mm · 3 of 89 slices shown]
[im 9/89  bone]
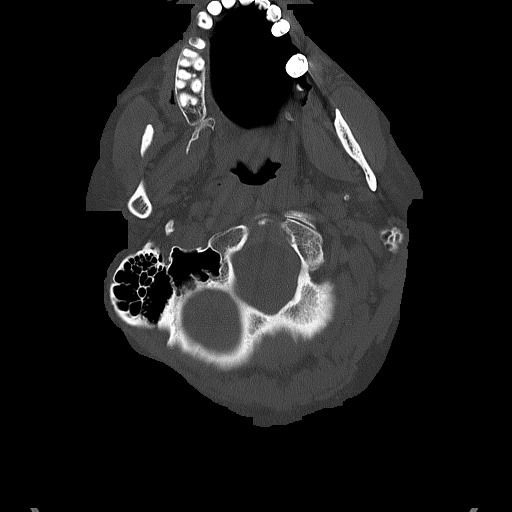
[im 18/89  bone]
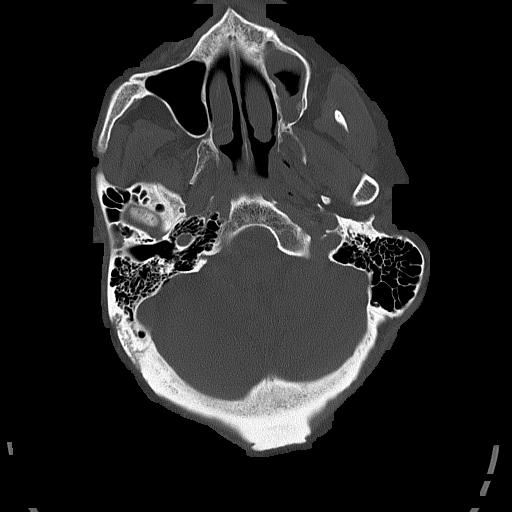
[im 27/89  bone]
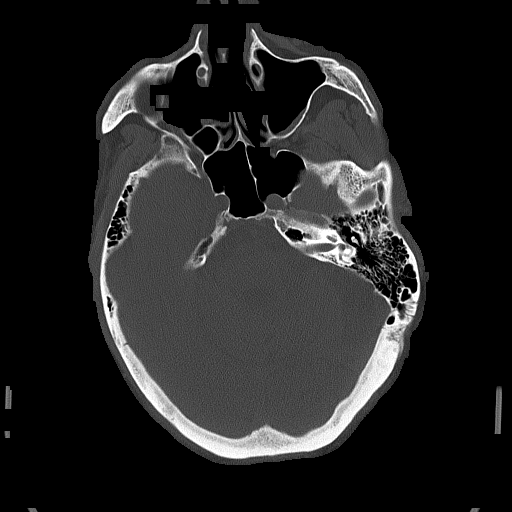

[Series 5: head without cor · coronal · non-contrast · 0.34mm/px · 3 of 76 slices shown]
[im 26/76  brain]
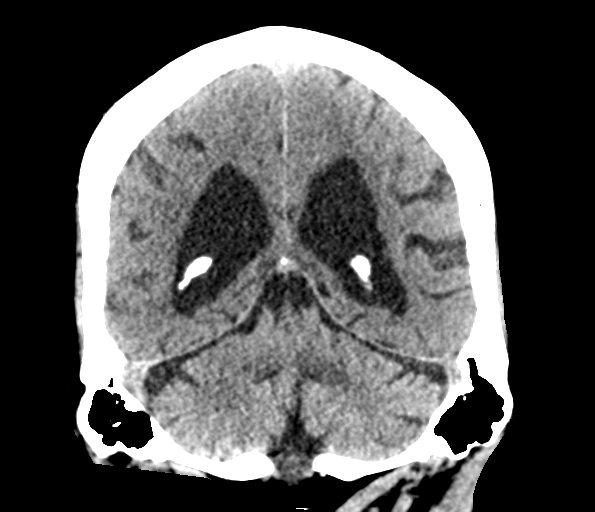
[im 34/76  brain]
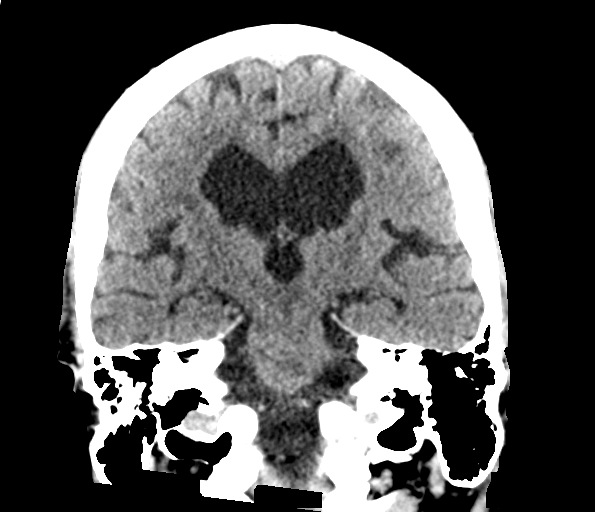
[im 42/76  brain]
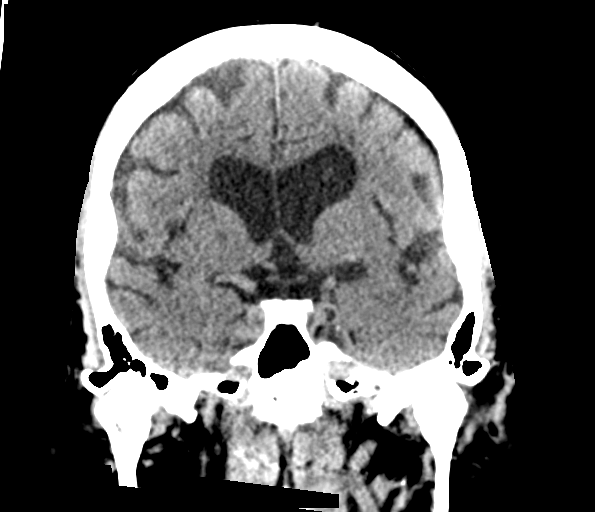

[Series 6: head without sag · sagittal · non-contrast · 0.34mm/px · 3 of 67 slices shown]
[im 23/67  brain]
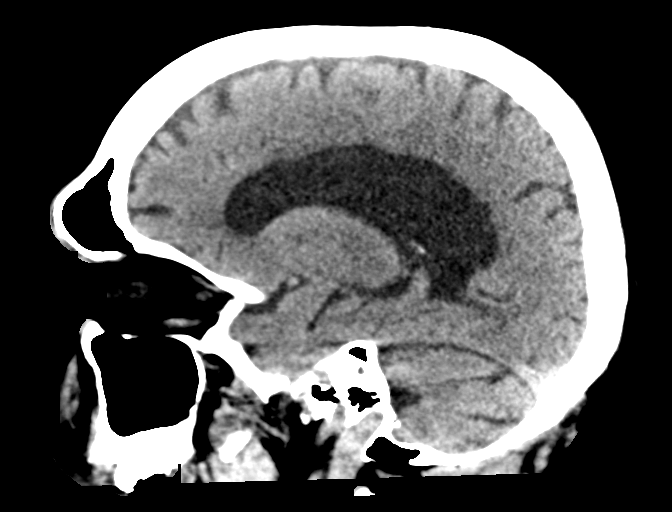
[im 34/67  brain]
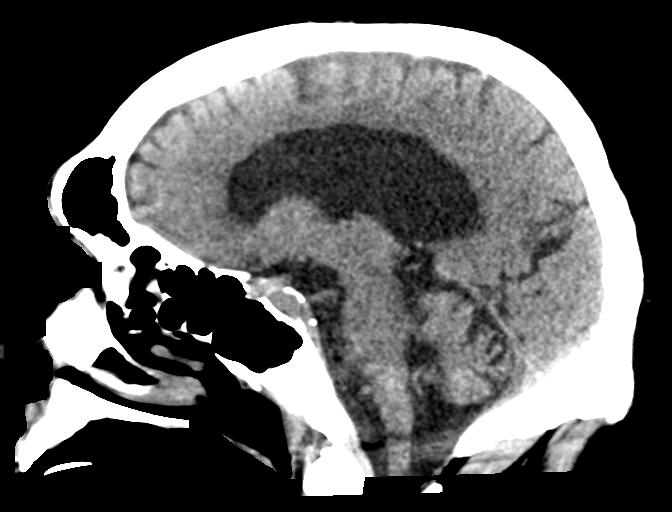
[im 45/67  brain]
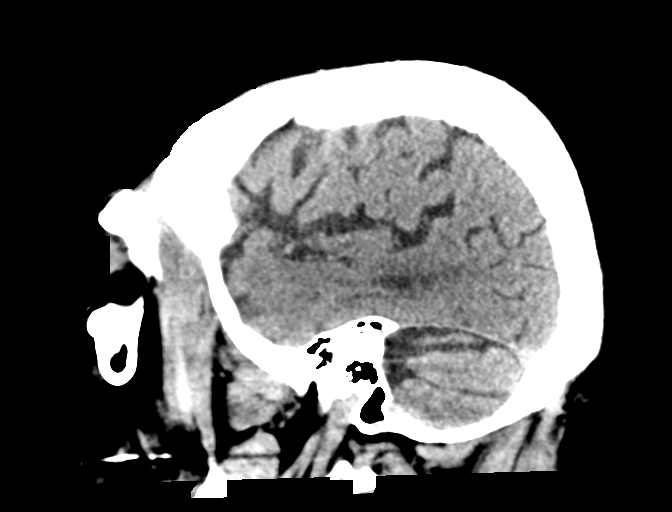

[16 of 47 positions shown; findings below may reference images not displayed]

FINDINGS: Brain: There is no mass, hemorrhage or extra-axial collection. There
is generalized atrophy without lobar predilection. Hypodensity of
the white matter is most commonly associated with chronic
microvascular disease.

Vascular: No abnormal hyperdensity of the major intracranial
arteries or dural venous sinuses. No intracranial atherosclerosis.

Skull: The visualized skull base, calvarium and extracranial soft
tissues are normal.

Sinuses/Orbits: No fluid levels or advanced mucosal thickening of
the visualized paranasal sinuses. No mastoid or middle ear effusion.
The orbits are normal.
IMPRESSION: 1. No acute intracranial abnormality.
2. Chronic microvascular ischemia and generalized atrophy.

## 2023-02-24 IMAGING — CT CT ABD-PELV W/O CM
2 of 4 series · 14 of 46 positions shown, 16 images · non-contrast
Comparison: No priors.

CLINICAL DATA: 65-year-old male with history of acute onset of
nonlocalized abdominal pain.



[Series 3: ap without · axial · non-contrast · 0.73mm/px · z∈[+800,+1250]mm · 11 of 102 slices shown, 13 images]
[im 6/102  soft-tissue]
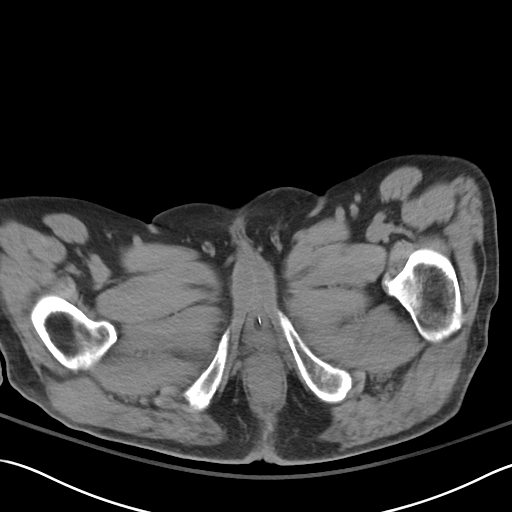
[im 6/102  bone]
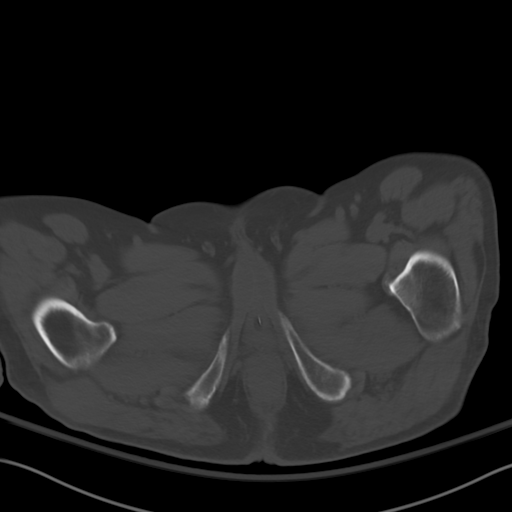
[im 16/102  soft-tissue]
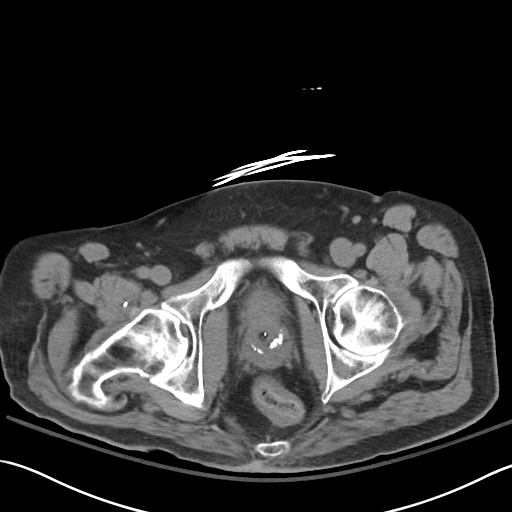
[im 26/102  soft-tissue]
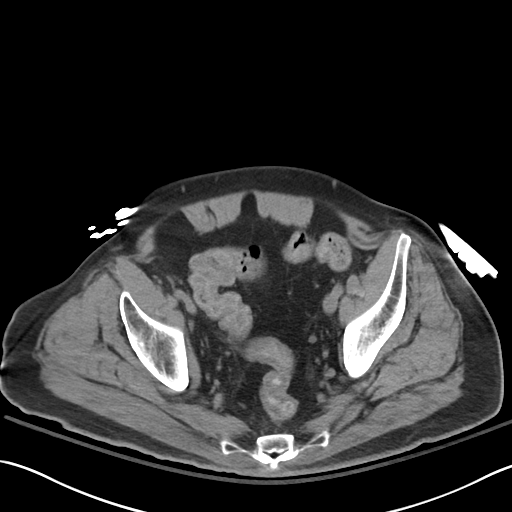
[im 36/102  soft-tissue]
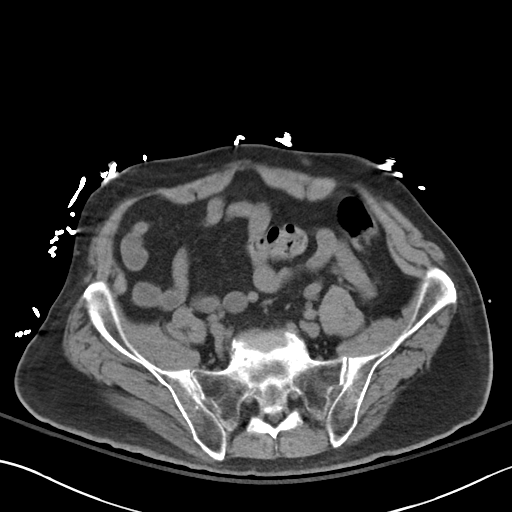
[im 41/102  soft-tissue]
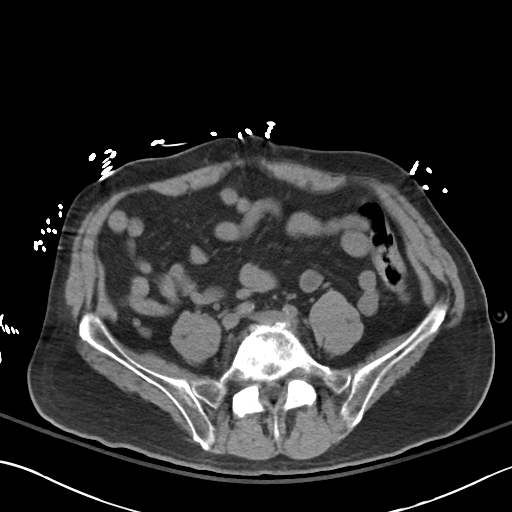
[im 51/102  soft-tissue]
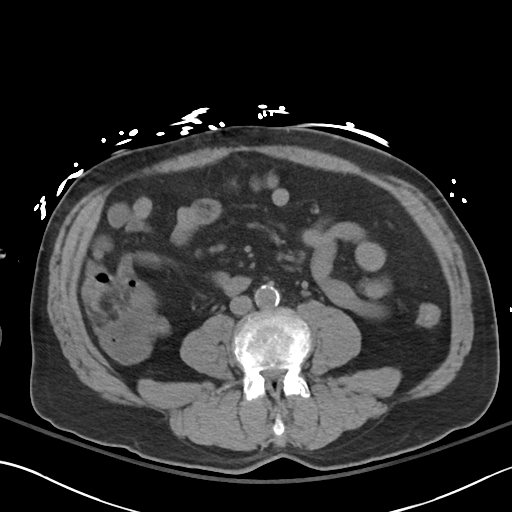
[im 61/102  soft-tissue]
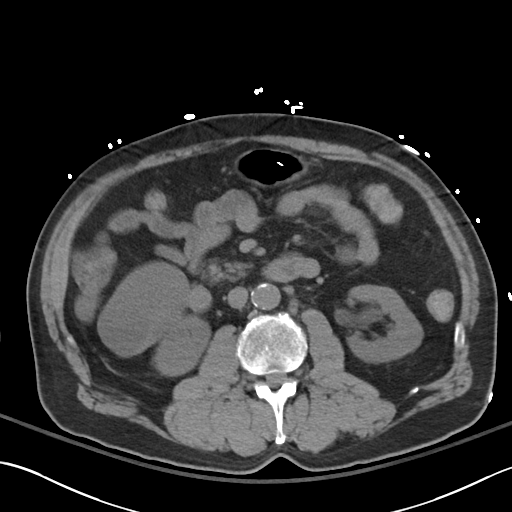
[im 66/102  soft-tissue]
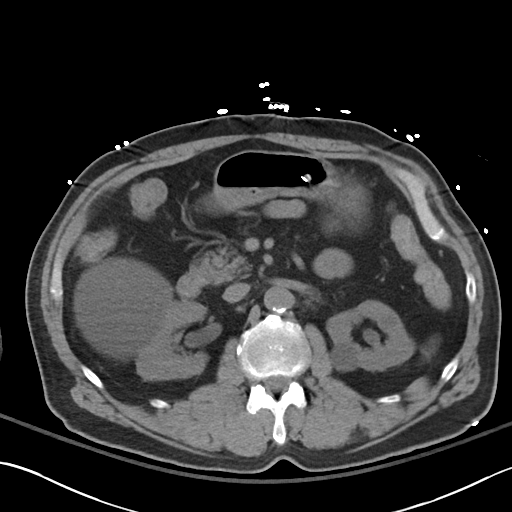
[im 76/102  soft-tissue]
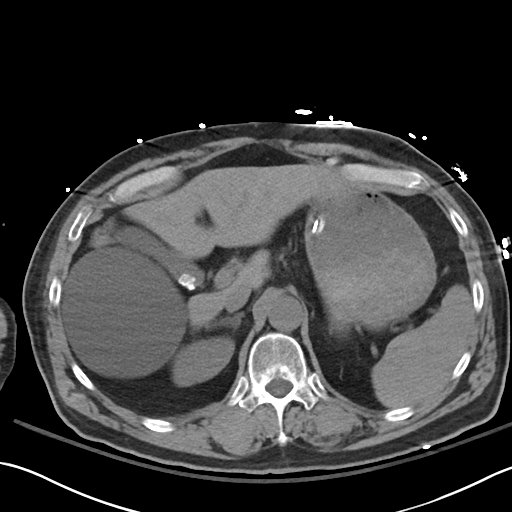
[im 76/102  bone]
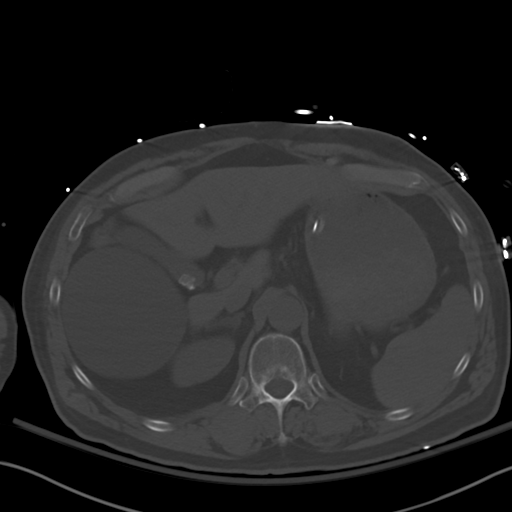
[im 86/102  soft-tissue]
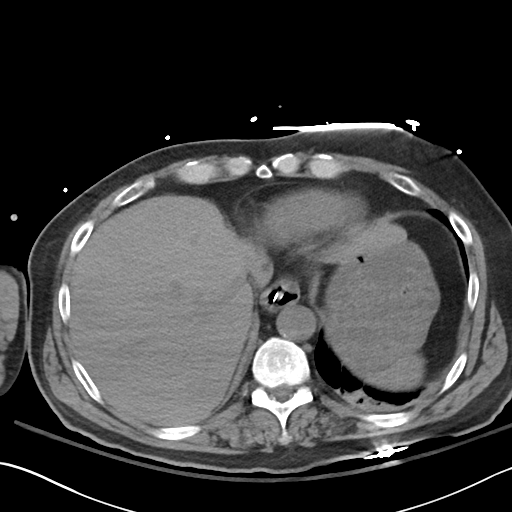
[im 96/102  soft-tissue]
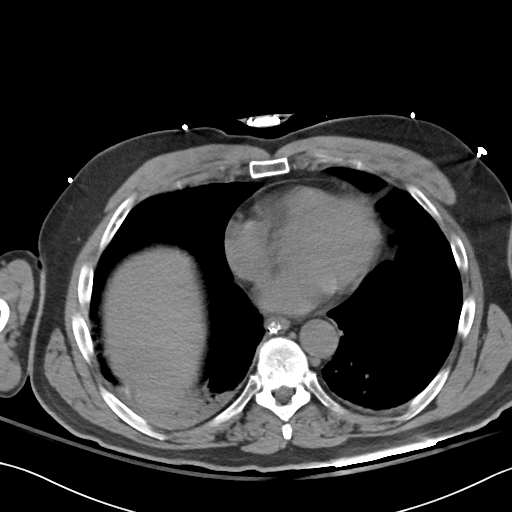

[Series 6: cor · coronal · 0.82mm/px · 3 of 91 slices shown]
[im 31/91  soft-tissue]
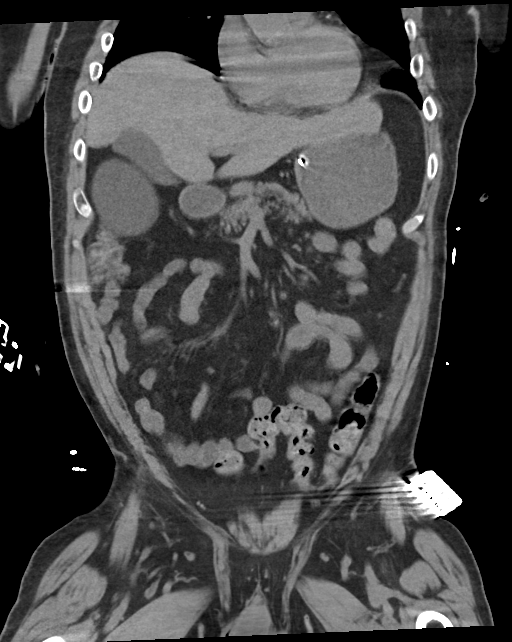
[im 41/91  soft-tissue]
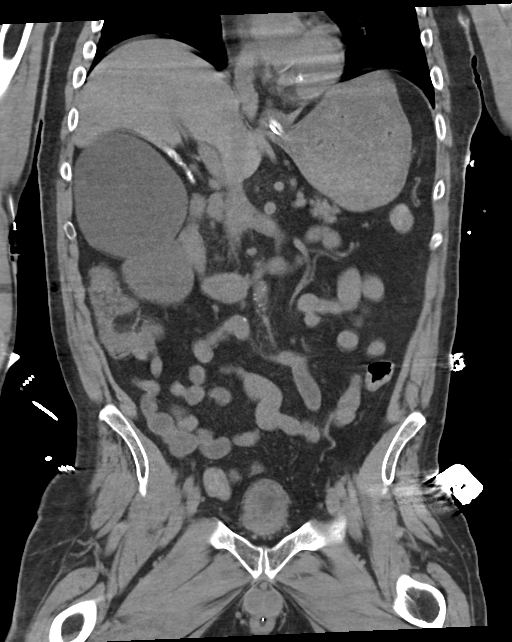
[im 51/91  soft-tissue]
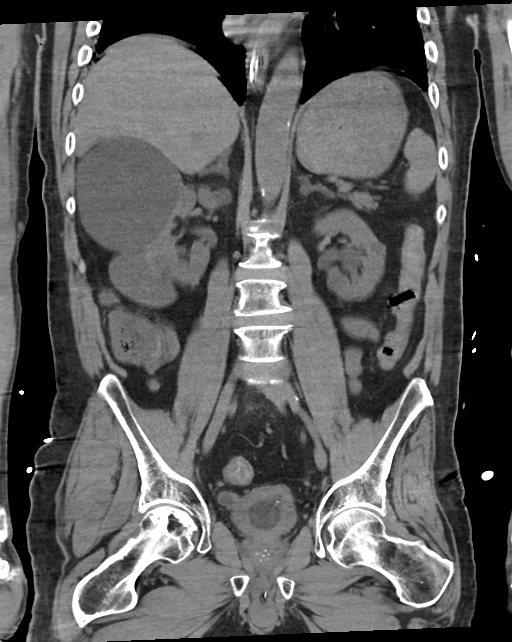

[14 of 46 positions shown; findings below may reference images not displayed]

FINDINGS: Lower chest: Mild scarring in the lung bases bilaterally (right
greater than left). Atherosclerotic calcifications in the descending
thoracic aorta as well as the left anterior descending coronary
artery. Mild calcifications of the aortic valve and mitral annulus.
Nasogastric tube in the distal esophagus extending into the stomach.

Hepatobiliary: No definite suspicious cystic or solid hepatic
lesions are confidently identified on today's noncontrast CT
examination. Small calcified gallstones lying dependently in the
gallbladder measuring 9 mm or less in size. Gallbladder is nearly
decompressed. No pericholecystic fluid or surrounding inflammatory
changes to indicate an acute cholecystitis at this time.

Pancreas: No definite pancreatic mass or peripancreatic fluid
collections or inflammatory changes are noted on today's noncontrast
CT examination.

Spleen: Unremarkable.

Adrenals/Urinary Tract: 3 mm nonobstructive calculus in the lower
pole collecting system of the left kidney. No additional calculi are
identified within the collecting system of the right kidney, along
the course of either ureter, or within the lumen of the urinary
bladder. No hydroureteronephrosis. Low-attenuation lesions in both
kidneys, incompletely characterized on today's non-contrast CT
examination, but statistically likely to represent cysts, largest of
which is exophytic extending from the upper pole of the right kidney
measuring up to 9.7 cm in diameter. In addition, there is a
low-intermediate attenuation lesion (23 HU) which extends
exophytically from the lower pole of the right kidney (axial image
43 of series 3) measuring 7.8 x 4.6 cm, with potential soft tissue
component along the medial wall, best appreciated on axial image 40
of series 3. Urinary bladder is nearly completely decompressed with
a Foley balloon catheter in place. Along the posterolateral wall of
the urinary bladder on the right side (axial image 81 of series 3)
there is what appears to be an exophytic outpouching of the urinary
bladder, which likely represents a diverticulum, measuring 4.0 x
cm with some intermediate attenuation material lying dependently
within this region. Bilateral adrenal glands are normal in
appearance.

Stomach/Bowel: Tip of nasogastric tube terminates in the antral
pre-pyloric region of the stomach. Stomach is otherwise unremarkable
in appearance. No pathologic dilatation of small bowel or colon.
Normal appendix.

Vascular/Lymphatic: No aneurysm identified in the visualized
abdominal vasculature. No lymphadenopathy noted in the abdomen or
pelvis.

Reproductive: Prostate gland and seminal vesicles are unremarkable
in appearance.

Other: No significant volume of ascites.  No pneumoperitoneum.

Musculoskeletal: There are no aggressive appearing lytic or blastic
lesions noted in the visualized portions of the skeleton.
IMPRESSION: 1. 3 mm nonobstructive calculus in the lower pole collecting system
of left kidney. No ureteral stones or findings of urinary tract
obstruction are noted at this time.
2. Cholelithiasis without evidence of acute cholecystitis at this
time.
3. Indeterminate lesion in the lower pole of the right kidney, with
imaging characteristics that could be concerning for potential
cystic neoplasm. Further evaluation with nonemergent abdominal MRI
with and without IV gadolinium is recommended in the near future to
better evaluate this finding. Alternatively, hematuria protocol CT
scan could be considered, which would also allow evaluation of the
bladder abnormality (discussed below).
4. Additionally, the patient has a right-sided bladder diverticulum,
with some dependent soft tissue or debris within the lumen of the
diverticulum. Outpatient referral to Urology for further clinical
evaluation is recommended.
5. Aortic atherosclerosis.
6. Additional incidental findings, as above.

## 2023-02-25 IMAGING — DX DG CHEST 1V PORT
1 series · 1 of 1 positions shown · non-contrast
Comparison: Portable chest yesterday at [DATE] a.m.

CLINICAL DATA: Acute respiratory failure with hypoxia.

EXAM:
PORTABLE CHEST 1 VIEW

[chest]
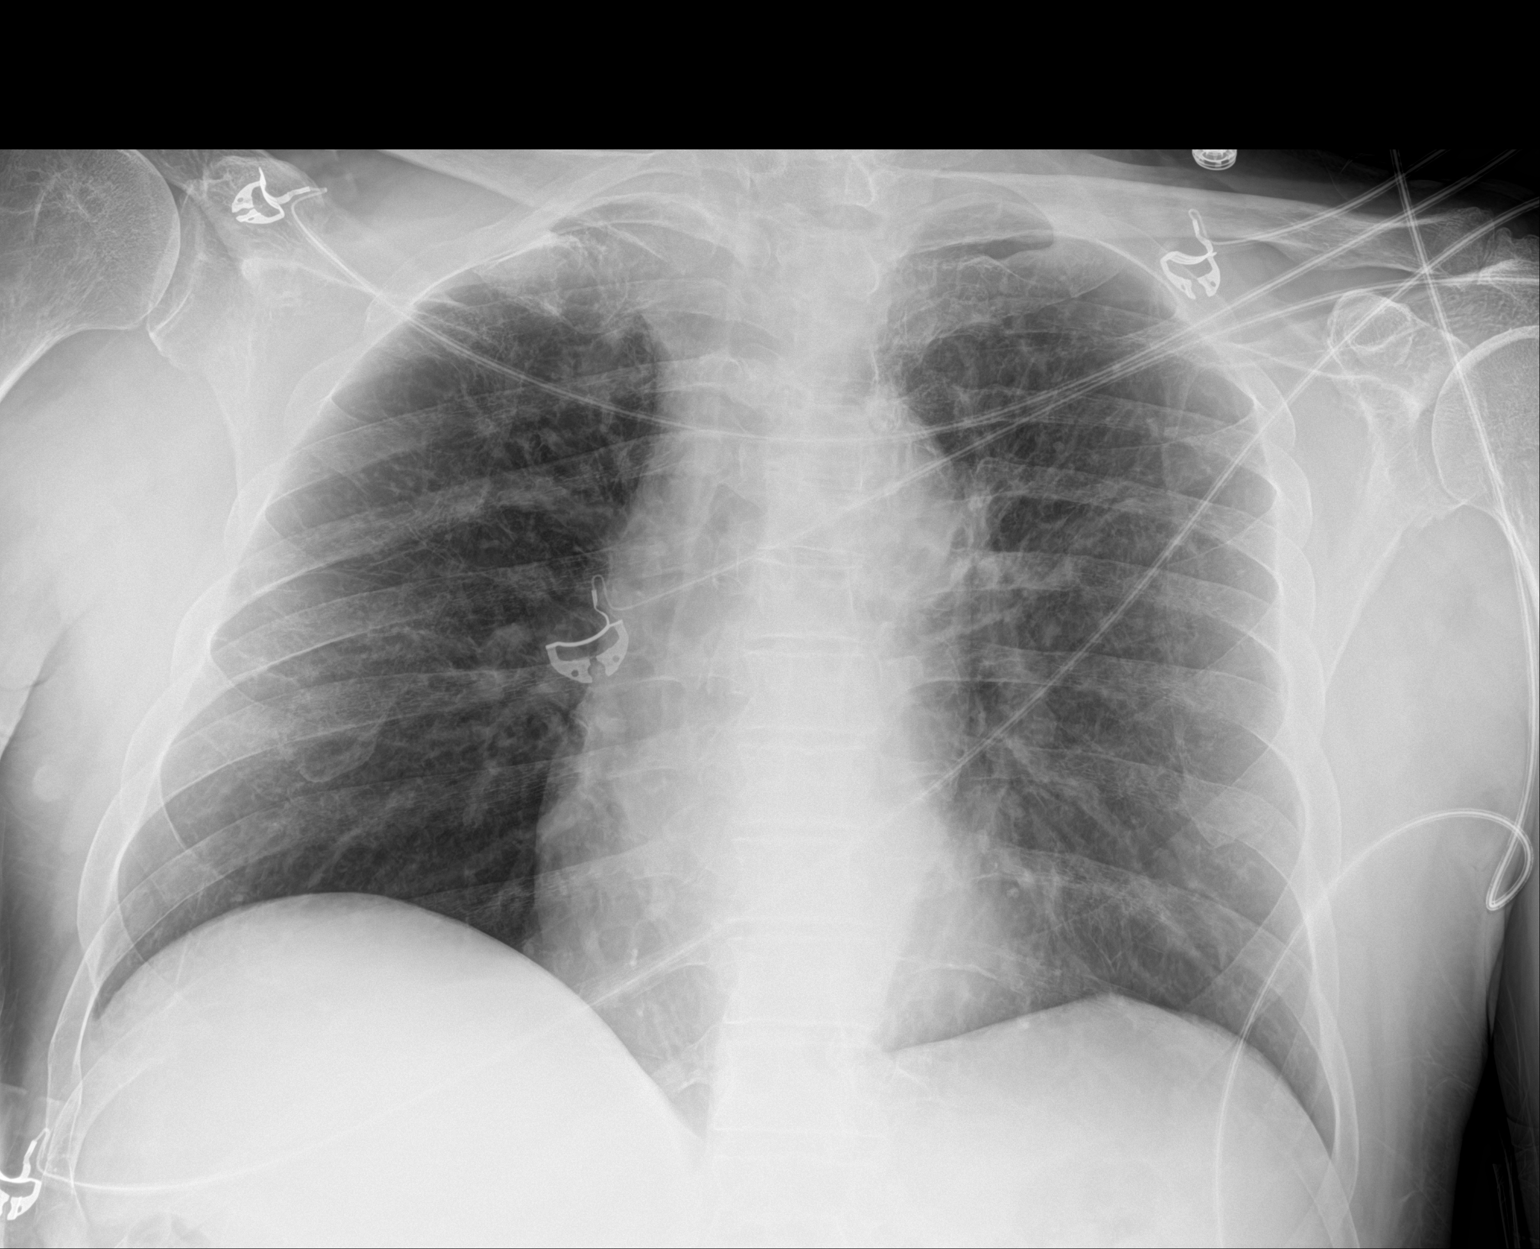

[1 of 1 positions shown; findings below may reference images not displayed]

FINDINGS: [DATE] a.m., 06/03/2021. Interval extubation and removal NGT. The
lungs emphysematous but generally clear. There is mild aortic
tortuosity and ectasia with calcification. Stable mediastinum. The
cardiac size is normal. The sulci are sharp. Mild osteopenia.
IMPRESSION: No evidence of acute chest disease.  COPD.  Aortic atherosclerosis.

## 2023-04-04 ENCOUNTER — Emergency Department (HOSPITAL_COMMUNITY): Payer: Medicare PPO

## 2023-04-04 ENCOUNTER — Emergency Department (HOSPITAL_COMMUNITY)
Admission: EM | Admit: 2023-04-04 | Discharge: 2023-04-04 | Disposition: A | Discharge status: Home / Self Care | Payer: Medicare PPO | Attending: Emergency Medicine | Admitting: Emergency Medicine

## 2023-04-04 ENCOUNTER — Other Ambulatory Visit: Payer: Self-pay

## 2023-04-04 DIAGNOSIS — S0181XA Laceration without foreign body of other part of head, initial encounter: Secondary | ICD-10-CM | POA: Diagnosis present

## 2023-04-04 DIAGNOSIS — W06XXXA Fall from bed, initial encounter: Secondary | ICD-10-CM | POA: Insufficient documentation

## 2023-04-04 DIAGNOSIS — Z23 Encounter for immunization: Secondary | ICD-10-CM | POA: Diagnosis not present

## 2023-04-04 DIAGNOSIS — W19XXXA Unspecified fall, initial encounter: Secondary | ICD-10-CM

## 2023-04-04 MED ORDER — BACITRACIN ZINC 500 UNIT/GM EX OINT: 1.0000 | TOPICAL_OINTMENT | Freq: Two times a day (BID) | CUTANEOUS | 0 refills | Status: AC

## 2023-04-04 MED ORDER — TETANUS-DIPHTH-ACELL PERTUSSIS 5-2.5-18.5 LF-MCG/0.5 IM SUSY
0.5000 mL | PREFILLED_SYRINGE | Freq: Once | INTRAMUSCULAR | Status: AC
  Administered 2023-04-04: 0.5 mL via INTRAMUSCULAR
  Filled 2023-04-04: qty 0.5

## 2023-04-04 MED ORDER — LIDOCAINE-EPINEPHRINE (PF) 2 %-1:200000 IJ SOLN
20.0000 mL | Freq: Once | INTRAMUSCULAR | Status: AC
  Administered 2023-04-04: 20 mL
  Filled 2023-04-04: qty 20

## 2023-04-04 NOTE — ED Notes (Signed)
Dr. Manus Gunning sutured patient's scalp laceration .

## 2023-04-04 NOTE — ED Notes (Signed)
Report given to [facility] Psychologist, clinical ) on patient's discharge plan , Diplomatic Services operational officer notified [facility] for transport .

## 2023-04-04 NOTE — ED Triage Notes (Signed)
Patient lost his balance while transferring from bed to wheelchair this evening , no LOC , presents with approx. 2 inches scalp laceration at left upper forehead . Bleeding controlled / not taking anticoagulant . Alert and oriented .

## 2023-04-04 NOTE — Discharge Instructions (Signed)
Keep wound clean and dry.  Follow-up for suture removal in 7 days.  Return to the ED with new or worsening symptoms.

## 2023-04-04 NOTE — ED Provider Notes (Signed)
[place] EMERGENCY DEPARTMENT AT [facility] Provider Note   CSN: [id number] Arrival date & time: 04/04/23  0310     History  Chief Complaint  Patient presents with   Head Laceration     Fall ( No thinners)     Derek Sims is a 68 y.o. male.  Patient bedbound from [place].  He does transfer to a wheelchair at baseline.  States he lost his balance while transferring from bed to wheelchair this evening and fell striking his head on the ground.  Believes he did lose consciousness.  Does not take any blood thinners.  Also sustained skin tear to his right hand.  Unknown last tetanus shot believes was more than 10 years ago.  Complains of pain to his head only.  No visual changes.  No neck or back pain.  No new weakness, numbness or tingling.  He states his right leg is very weak at baseline and unchanged today.  Denies any other injury.  No neck, back, chest or abdominal pain.  No preceding dizziness or lightheadedness.  The history is provided by the patient.       Home Medications Prior to Admission medications   Medication Sig Start Date End Date Taking? Authorizing Provider  acetaminophen (TYLENOL) 325 MG tablet Take 2 tablets (650 mg total) by mouth every 6 (six) hours as needed for mild pain, fever or headache. 11/23/22   Standley Brooking, MD  Incontinence Supplies (URINARY LEG BAG) MISC  01/26/14   [provider]  Parenteral Therapy Supplies (CATHETER ADAPTER) MISC  06/22/14   [provider]      Allergies    Patient has no known allergies.    Review of Systems   Review of Systems  Constitutional:  Negative for activity change, appetite change, chills and fever.  HENT:  Negative for congestion and rhinorrhea.   Respiratory:  Negative for cough, chest tightness and shortness of breath.   Cardiovascular:  Negative for chest pain.  Gastrointestinal:  Negative for abdominal pain, nausea and vomiting.  Genitourinary:  Negative for dysuria  and hematuria.  Skin:  Positive for wound.  Neurological:  Positive for headaches. Negative for dizziness and weakness.    all other systems are negative except as noted in the HPI and PMH.   Physical Exam Updated Vital Signs BP (!) 154/88 (BP Location: Right Arm)   Pulse 78   Temp (!) 97 F (36.1 C) (Temporal)   Resp 20   SpO2 100%  Physical Exam Vitals and nursing note reviewed.  Constitutional:      General: He is not in acute distress.    Appearance: He is well-developed.  HENT:     Head: Normocephalic.     Comments: Linear laceration left forehead approximately 3 cm.  Hemostatic.    Mouth/Throat:     Pharynx: No oropharyngeal exudate.  Eyes:     Conjunctiva/sclera: Conjunctivae normal.     Pupils: Pupils are equal, round, and reactive to light.  Neck:     Comments: Paraspinal C-spine tenderness, no midline tenderness Cardiovascular:     Rate and Rhythm: Normal rate and regular rhythm.     Heart sounds: Normal heart sounds. No murmur heard. Pulmonary:     Effort: Pulmonary effort is normal. No respiratory distress.     Breath sounds: Normal breath sounds.  Abdominal:     Palpations: Abdomen is soft.     Tenderness: There is no abdominal tenderness. There is no guarding or  rebound.  Musculoskeletal:        General: No tenderness. Normal range of motion.     Cervical back: Normal range of motion and neck supple.     Comments:  Skin tear right dorsal hand  Skin:    General: Skin is warm.  Neurological:     Mental Status: He is alert and oriented to person, place, and time.     Cranial Nerves: No cranial nerve deficit.     Motor: No abnormal muscle tone.     Coordination: Coordination normal.     Comments: Cranial nerves II to XII intact, no nystagmus.  Tongue is midline.  Equal upper extremity strength.  Weakness to the right leg at baseline.  Lower extremity weakness at baseline.  No pain with range of motion of hips or knees.  Psychiatric:        Behavior:  Behavior normal.     ED Results / Procedures / Treatments   Labs (all labs ordered are listed, but only abnormal results are displayed) Labs Reviewed - No data to display  EKG None  Radiology CT Head Wo Contrast Result Date: 04/04/2023 CLINICAL DATA:  Larey Seat transferring from bed to wheelchair, with head and neck trauma. Left forehead trauma and bleeding. EXAM: CT HEAD WITHOUT CONTRAST CT CERVICAL SPINE WITHOUT CONTRAST TECHNIQUE: Multidetector CT imaging of the head and cervical spine was performed following the standard protocol without intravenous contrast. Multiplanar CT image reconstructions of the cervical spine were also generated. RADIATION DOSE REDUCTION: This exam was performed according to the departmental dose-optimization program which includes automated exposure control, adjustment of the mA and/or kV according to patient size and/or use of iterative reconstruction technique. COMPARISON:  Head CT 06/02/2021.  No prior cervical spine imaging. FINDINGS: CT HEAD FINDINGS Brain: There is moderately developed global atrophy with atrophic ventriculomegaly and moderate small-vessel disease of the cerebral white matter. No cortical based acute infarct, hemorrhage, mass or mass effect are seen. There is no midline shift. Basal cisterns are clear. There are scattered benign dural calcifications along the falx. Vascular: No hyperdense vessel or unexpected calcification. Skull: Negative for fractures or focal lesions. No appreciable scalp hematoma. Sinuses/Orbits: There is multifocal opacification of the ethmoid air cells. Bilateral increased membrane thickening in the maxillary sinuses. The frontal and sphenoid sinus, bilateral mastoid air cells and middle ears are clear. Visualized orbits are unremarkable. Other: None. CT CERVICAL SPINE FINDINGS Alignment: There is a minimal grade 1 anterolisthesis at C7-T1, T1-2 and T2-3, likely degenerative. No traumatic or further listhesis is suspected. There is  bone-on-bone anterior atlantodental joint space loss with osteophytes. Skull base and vertebrae: There is osteopenia. No acute fracture is evident. No primary bone lesion or focal pathologic process. Reactive peridiscal endplate sclerosis is noted at C5-6 most likely due to discogenic degenerative arthrosis with chronic disc collapse. Soft tissues and spinal canal: No prevertebral fluid or swelling. No visible canal hematoma. There are mild calcific plaques in the left carotid bifurcation. No thyroid or laryngeal mass. Bilateral palatine tonsillar stones and bilateral parotid fatty replacement are also seen. Disc levels: There is complete chronic disc collapse C5-6, moderate disc space loss C4-5 and C6-7, normal disc heights of C2-3, C3-4, C7-T1. Bidirectional endplate spurs are noted C4-5 through C7-T1. There are congenitally short pedicles as well, which decreases the effective AP diameter to about 8 mm at most levels. As a result, relatively mild posterior disc osteophyte complexes do cause moderate spinal canal stenosis with spondylotic cord compression at C4-5 and  C5-6, to a lesser extent C6-7. There is ankylosis across the right C3-4 facets, multilevel facet joint and uncinate spurring. Secondary acquired foraminal stenosis is bilaterally moderate to severe at C4-5 and C5-6. Other foramina are not significantly stenotic. Calcified pannus slightly distends the dorsal atlantodental joint space but does not compress the cord. Upper chest: There are emphysematous and scarring changes both lung apices, not otherwise well characterized due to respiratory motion. There is aortic atherosclerosis. Other: None. IMPRESSION: 1. No acute intracranial CT findings or depressed skull fractures. 2. Atrophy and small-vessel disease. 3. Sinusitis, greatest in the ethmoids. 4. Osteopenia and degenerative change without evidence of cervical fractures. 5. Minimal grade 1 anterolisthesis C7-T1, T1-2 and T2-3, likely degenerative. 6.  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 7. Aortic and left carotid atherosclerosis. 8. Emphysema and scarring in the lung apices. Aortic Atherosclerosis (ICD10-I70.0) and Emphysema (ICD10-J43.9). Electronically Signed   By: Almira Bar M.D.   On: 04/04/2023 04:52   CT Cervical Spine Wo Contrast Result Date: 04/04/2023 CLINICAL DATA:  Larey Seat transferring from bed to wheelchair, with head and neck trauma. Left forehead trauma and bleeding. EXAM: CT HEAD WITHOUT CONTRAST CT CERVICAL SPINE WITHOUT CONTRAST TECHNIQUE: Multidetector CT imaging of the head and cervical spine was performed following the standard protocol without intravenous contrast. Multiplanar CT image reconstructions of the cervical spine were also generated. RADIATION DOSE REDUCTION: This exam was performed according to the departmental dose-optimization program which includes automated exposure control, adjustment of the mA and/or kV according to patient size and/or use of iterative reconstruction technique. COMPARISON:  Head CT 06/02/2021.  No prior cervical spine imaging. FINDINGS: CT HEAD FINDINGS Brain: There is moderately developed global atrophy with atrophic ventriculomegaly and moderate small-vessel disease of the cerebral white matter. No cortical based acute infarct, hemorrhage, mass or mass effect are seen. There is no midline shift. Basal cisterns are clear. There are scattered benign dural calcifications along the falx. Vascular: No hyperdense vessel or unexpected calcification. Skull: Negative for fractures or focal lesions. No appreciable scalp hematoma. Sinuses/Orbits: There is multifocal opacification of the ethmoid air cells. Bilateral increased membrane thickening in the maxillary sinuses. The frontal and sphenoid sinus, bilateral mastoid air cells and middle ears are clear. Visualized orbits are unremarkable.  Other: None. CT CERVICAL SPINE FINDINGS Alignment: There is a minimal grade 1 anterolisthesis at C7-T1, T1-2 and T2-3, likely degenerative. No traumatic or further listhesis is suspected. There is bone-on-bone anterior atlantodental joint space loss with osteophytes. Skull base and vertebrae: There is osteopenia. No acute fracture is evident. No primary bone lesion or focal pathologic process. Reactive peridiscal endplate sclerosis is noted at C5-6 most likely due to discogenic degenerative arthrosis with chronic disc collapse. Soft tissues and spinal canal: No prevertebral fluid or swelling. No visible canal hematoma. There are mild calcific plaques in the left carotid bifurcation. No thyroid or laryngeal mass. Bilateral palatine tonsillar stones and bilateral parotid fatty replacement are also seen. Disc levels: There is complete chronic disc collapse C5-6, moderate disc space loss C4-5 and C6-7, normal disc heights of C2-3, C3-4, C7-T1. Bidirectional endplate spurs are noted C4-5 through C7-T1. There are congenitally short pedicles as well, which decreases the effective AP diameter to about 8 mm at most levels. As a result, relatively mild posterior disc osteophyte complexes do cause moderate spinal canal stenosis with spondylotic cord compression at C4-5 and C5-6, to a lesser extent C6-7. There is ankylosis  across the right C3-4 facets, multilevel facet joint and uncinate spurring. Secondary acquired foraminal stenosis is bilaterally moderate to severe at C4-5 and C5-6. Other foramina are not significantly stenotic. Calcified pannus slightly distends the dorsal atlantodental joint space but does not compress the cord. Upper chest: There are emphysematous and scarring changes both lung apices, not otherwise well characterized due to respiratory motion. There is aortic atherosclerosis. Other: None. IMPRESSION: 1. No acute intracranial CT findings or depressed skull fractures. 2. Atrophy and small-vessel disease.  3. Sinusitis, greatest in the ethmoids. 4. Osteopenia and degenerative change without evidence of cervical fractures. 5. Minimal grade 1 anterolisthesis C7-T1, T1-2 and T2-3, likely degenerative. 6. 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 7. Aortic and left carotid atherosclerosis. 8. Emphysema and scarring in the lung apices. Aortic Atherosclerosis (ICD10-I70.0) and Emphysema (ICD10-J43.9). Electronically Signed   By: Almira Bar M.D.   On: 04/04/2023 04:52    Procedures .Laceration Repair  Date/Time: 04/04/2023 4:44 AM  Performed by: Glynn Octave, MD Authorized by: Glynn Octave, MD   Consent:    Consent obtained:  Verbal   Consent given by:  Patient   Risks, benefits, and alternatives were discussed: yes     Risks discussed:  Infection, need for additional repair, nerve damage, poor wound healing, poor cosmetic result, pain, retained foreign body, tendon damage and vascular damage   Alternatives discussed:  No treatment Universal protocol:    Procedure explained and questions answered to patient or proxy's satisfaction: yes     Relevant documents present and verified: yes     Test results available: yes     Imaging studies available: yes     Immediately prior to procedure, a time out was called: yes     Patient identity confirmed:  Verbally with patient Anesthesia:    Anesthesia method:  Local infiltration   Local anesthetic:  Lidocaine 2% WITH epi Laceration details:    Location:  Face   Face location:  Forehead   Length (cm):  3 Pre-procedure details:    Preparation:  Patient was prepped and draped in usual sterile fashion Exploration:    Limited defect created (wound extended): no     Hemostasis achieved with:  Epinephrine and direct pressure   Imaging outcome: foreign body not noted     Wound exploration: wound explored through full range of  motion and entire depth of wound visualized     Wound extent: no underlying fracture and no vascular damage     Contaminated: no   Treatment:    Area cleansed with:  Povidone-iodine   Amount of cleaning:  Standard   Irrigation solution:  Sterile saline   Irrigation method:  Pressure wash   Visualized foreign bodies/material removed: no     Debridement:  None   Undermining:  None Skin repair:    Repair method:  Sutures   Suture size:  5-0   Suture material:  Prolene   Suture technique:  Simple interrupted   Number of sutures:  6 Approximation:    Approximation:  Close Repair type:    Repair type:  Simple Post-procedure details:    Dressing:  Antibiotic ointment and adhesive bandage   Procedure completion:  Tolerated well, no immediate complications     Medications Ordered in ED Medications  lidocaine-EPINEPHrine (XYLOCAINE W/EPI) 2 %-1:200000 (PF) injection 20 mL (has no administration in time range)    ED Course/ Medical Decision Making/  A&P                                 Medical Decision Making Amount and/or Complexity of Data Reviewed Independent Historian: EMS Labs: ordered. Decision-making details documented in ED Course. Radiology: ordered and independent interpretation performed. Decision-making details documented in ED Course. ECG/medicine tests: ordered and independent interpretation performed. Decision-making details documented in ED Course.  Risk Prescription drug management.   Fall with head injury and scalp laceration.  Vital stable.  No distress.  Given loss of consciousness will obtain CT scan.  Will update tetanus.  At neurological baseline.  CT head negative for hemorrhage or skull fracture. No cervical spine fracture.  Tetanus updated.  Laceration cleaned and repaired as above.  Follow-up with PCP for suture removal in 7 days.  Wound care instructions given.  Return precautions discussed.       Final Clinical Impression(s) / ED  Diagnoses Final diagnoses:  None    Rx / DC Orders ED Discharge Orders     None         Marvion Bastidas, Jeannett Senior, MD 04/04/23 [phone]
